# Patient Record
Sex: Male | Born: 1970 | Race: White | Hispanic: No
Health system: Western US, Community
[De-identification: ages and names within clinical notes are randomized; demographics above are authoritative.]

---

## 2014-01-12 ENCOUNTER — Encounter: Admit: 2014-01-12 | Discharge: 2014-01-12 | Primary: MD

## 2015-07-27 LAB — HERPES SIMPLEX VIRUS 1&2 IGG & IGM
HSV 1 IgG: NEGATIVE
HSV 2 IgG: NEGATIVE
Herpes Simplex 1&2, IgM: NEGATIVE

## 2015-07-27 LAB — HIV ANTIGEN & ANTIBODY SCREEN: HIV1/HIV2 Antibody/Antigen Screen: NONREACTIVE

## 2015-07-27 LAB — CHLAMYDIA BY PCR (URINE): Chlamydia trachomatis by PCR: NOT DETECTED

## 2015-07-27 LAB — TREPONEMA PALLIDUM (SYPHILIS) ANTIBODY
Treponema Antibody Index: <0.1 IV (ref <0.9)
Treponema Antibody: NEGATIVE

## 2015-07-27 LAB — NEISSERIA GONORRHOEAE BY PCR, URINE: Neisseria gonorrhoeae by PCR: NOT DETECTED

## 2017-11-06 ENCOUNTER — Inpatient Hospital Stay
Admit: 2017-11-06 | Discharge: 2017-11-06 | Disposition: A | Discharge status: Home / Self Care | Payer: PRIVATE HEALTH INSURANCE | Attending: MD

## 2017-11-06 ENCOUNTER — Emergency Department: Admit: 2017-11-06 | Payer: PRIVATE HEALTH INSURANCE | Primary: MD

## 2017-11-06 DIAGNOSIS — K469 Unspecified abdominal hernia without obstruction or gangrene: Secondary | ICD-10-CM

## 2017-11-06 LAB — CBC WITH AUTO DIFFERENTIAL
Basophils %: 0 % (ref 0–2)
Basophils, Absolute: 0.1 10*3/ÂµL (ref 0.0–0.2)
Eosinophils %: 0 % (ref 0–7)
Eosinophils, Absolute: 0.1 10*3/ÂµL (ref 0.0–0.7)
HCT: 46.8 % (ref 42.0–54.0)
Hemoglobin: 15.7 g/dL (ref 12.0–18.0)
Lymphocytes %: 8 % — ABNORMAL LOW (ref 25–45)
Lymphocytes, Absolute: 1.3 10*3/ÂµL (ref 1.1–4.3)
MCH: 30.4 pg (ref 27.0–34.0)
MCHC: 33.5 g/dL (ref 32.0–36.0)
MCV: 90.5 fL (ref 81.0–99.0)
MPV: 9 fL (ref 7.4–10.4)
Monocytes %: 6 % (ref 0–12)
Monocytes, Absolute: 0.9 10*3/ÂµL (ref 0.0–1.2)
Neutrophils %: 86 % — ABNORMAL HIGH (ref 35–70)
Neutrophils, Absolute: 14.2 10*3/ÂµL — ABNORMAL HIGH (ref 1.6–7.3)
Platelet Count: 273 10*3/ÂµL (ref 150–400)
RBC: 5.17 10*6/ÂµL (ref 4.70–6.10)
RDW: 13.4 % (ref 11.5–14.5)
WBC: 16.5 10*3/ÂµL — ABNORMAL HIGH (ref 4.8–10.8)

## 2017-11-06 LAB — COMPREHENSIVE METABOLIC PANEL
ALT - Alanine Aminotransferase: 15 IU/L (ref 7–52)
AST - Aspartate Aminotransferase: 21 IU/L (ref 10–50)
Albumin/Globulin Ratio: 1.6 (ref >0.9)
Albumin: 4.4 g/dL (ref 3.5–5.0)
Alkaline Phosphatase: 72 IU/L (ref 34–104)
Anion Gap: 12 mmol/L (ref 4–13)
BUN: 12 mg/dL (ref 6–23)
Bilirubin Total: 1 mg/dL (ref 0.3–1.2)
CO2 - Carbon Dioxide: 25 mmol/L (ref 21–31)
Calcium: 9.8 mg/dL (ref 8.6–10.3)
Chloride: 99 mmol/L (ref 98–111)
Creatinine: 0.9 mg/dL (ref 0.65–1.30)
GFR Estimate Male: >60 mL/min/{1.73_m2} (ref >=60)
Globulin: 2.8 g/dL (ref 2.2–3.7)
Glucose: 101 mg/dL — ABNORMAL HIGH (ref 80–99)
Potassium: 3.8 mmol/L (ref 3.5–5.1)
Protein Total: 7.2 g/dL (ref 6.0–8.0)
Sodium: 136 mmol/L (ref 135–143)

## 2017-11-06 LAB — PT & PTT
APTT: 37 Seconds — ABNORMAL HIGH (ref 23–36)
INR: 1.1 (ref 0.9–1.1)
Protime: 11.5 Seconds (ref 9.5–12.0)

## 2017-11-06 LAB — LIPASE: Lipase: 21 IU/L (ref 10–60)

## 2017-11-06 MED ORDER — sodium chloride 0.9 % (NS) syringe 10 mL
Freq: Once | INTRAMUSCULAR | Status: DC
Start: 2017-11-06 — End: 2017-11-06

## 2017-11-06 MED ORDER — docusate sodium (COLACE) 100 MG capsule
100 | ORAL_CAPSULE | Freq: Two times a day (BID) | ORAL | 0 refills | 30.00000 days | Status: AC
Start: 2017-11-06 — End: 2017-11-16

## 2017-11-06 MED ORDER — sodium chloride (NS) 0.9% bolus 50 mL
0.9 | Freq: Once | INTRAVENOUS | Status: DC
Start: 2017-11-06 — End: 2017-11-06

## 2017-11-06 MED ORDER — iohexol (OMNIPAQUE) 350 mg iodine/mL injection 80 mL
350 | Freq: Once | INTRAVENOUS | Status: DC | PRN
Start: 2017-11-06 — End: 2017-11-06

## 2017-11-06 MED ORDER — GI cocktail
2 | Freq: Once | Status: AC
Start: 2017-11-06 — End: 2017-11-06
  Administered 2017-11-06: 15:00:00 via ORAL

## 2017-11-06 MED ORDER — HYDROcodone-acetaminophen (NORCO) 5-325 mg per tablet
5-325 | ORAL_TABLET | Freq: Four times a day (QID) | ORAL | 0 refills | 30.00000 days | Status: AC | PRN
Start: 2017-11-06 — End: 2017-11-13

## 2017-11-06 MED ORDER — sodium chloride (NS) 0.9% bolus 1,000 mL
Freq: Once | INTRAVENOUS | Status: AC
Start: 2017-11-06 — End: 2017-11-06
  Administered 2017-11-06 (×2): via INTRAVENOUS

## 2017-11-06 MED FILL — SODIUM CHLORIDE 0.9 % INTRAVENOUS SOLUTION: 1000.0000 mL | INTRAVENOUS | Qty: 1000

## 2017-11-06 MED FILL — MAG-AL PLUS 200 MG-200 MG-20 MG/5 ML ORAL SUSPENSION: 200-200-20 | ORAL | Qty: 30

## 2017-11-06 NOTE — Discharge Instructions (Signed)
Patient Education     As we discussed, you have a small fat-containing hernia above your belly button. You may be at risk for a recurrent bowel containing hernia in the future. Follow up with a primary care doctor for ongoing on a training of this problem. Take pain medicine as instructed and continue your high-fiber diet and consider a laxative to avoid constipation. Return here if you have a lump in her abdomen, vomiting, fever, worsening pain, or if you're not improving in 48 hours. No heavy lifting for at least one week.    Hernia  A hernia happens when an organ or tissue inside your body pushes out through a weak spot in the belly (abdomen).  Follow these instructions at home:  ? Avoid stretching or overusing (straining) the muscles near the hernia.  ? Do not lift anything heavier than 10 lb (4.5 kg).  ? Use the muscles in your leg when you lift something up. Do not use the muscles in your back.  ? When you cough, try to cough gently.  ? Eat a diet that has a lot of fiber. Eat lots of fruits and vegetables.  ? Drink enough fluids to keep your pee (urine) clear or pale yellow. Try to drink 6-8 glasses of water a day.  ? Take medicines to make your poop soft (stool softeners) as told by your doctor.  ? Lose weight, if you are overweight.  ? Do not use any tobacco products, including cigarettes, chewing tobacco, or electronic cigarettes. If you need help quitting, ask your doctor.  ? Keep all follow-up visits as told by your doctor. This is important.  Contact a doctor if:  ? The skin by the hernia gets puffy (swollen) or red.  ? The hernia is painful.  Get help right away if:  ? You have a fever.  ? You have belly pain that is getting worse.  ? You feel sick to your stomach (nauseous) or you throw up (vomit).  ? You cannot push the hernia back in place by gently pressing on it while you are lying down.  ? The hernia:  ? Changes in shape or size.  ? Is stuck outside your belly.  ? Changes color.  ? Feels hard or  tender.  This information is not intended to replace advice given to you by your health care provider. Make sure you discuss any questions you have with your health care provider.  Document Released: 01/15/2010 Document Revised: 01/03/2016 Document Reviewed: 06/07/2014  Elsevier Interactive Patient Education ? 2018 [employer]       Patient Education     Ventral Hernia  A ventral hernia is a bulge of tissue from inside the abdomen that pushes through a weak area of the muscles that form the front wall of the abdomen. The tissues inside the abdomen are inside a sac (peritoneum). These tissues include the small intestine, large intestine, and the fatty tissue that covers the intestines (omentum). Sometimes, the bulge that forms a hernia contains intestines. Other hernias contain only fat. Ventral hernias do not go away without surgical treatment.  There are several types of ventral hernias. You may have:  ? A hernia at an incision site from previous abdominal surgery (incisional hernia).  ? A hernia just above the belly button (epigastric hernia), or at the belly button (umbilical hernia). These types of hernias can develop from heavy lifting or straining.  ? A hernia that comes and goes (reducible hernia). It may be  visible only when you lift or strain. This type of hernia can be pushed back into the abdomen (reduced).  ? A hernia that traps abdominal tissue inside the hernia (incarcerated hernia). This type of hernia does not reduce.  ? A hernia that cuts off blood flow to the tissues inside the hernia (strangulated hernia). The tissues can start to die if this happens. This is a very painful bulge that cannot be reduced. A strangulated hernia is a medical emergency.  What are the causes?  This condition is caused by abdominal tissue putting pressure on an area of weakness in the abdominal muscles.  What increases the risk?  The following factors may make you more likely to develop this condition:  ? Being  male.  ? Being 6 or older.  ? Being overweight or obese.  ? Having had previous abdominal surgery, especially if there was an infection after surgery.  ? Having had an injury to the abdominal wall.  ? Having had several pregnancies.  ? Having a buildup of fluid inside the abdomen (ascites).  What are the signs or symptoms?  The only symptom of a ventral hernia may be a painless bulge in the abdomen. A reducible hernia may be visible only when you strain, cough, or lift. Other symptoms may include:  ? Dull pain.  ? A feeling of pressure.  Signs and symptoms of a strangulated hernia may include:  ? Increasing pain.  ? Nausea and vomiting.  ? Pain when pressing on the hernia.  ? The skin over the hernia turning red or purple.  ? Constipation.  ? Blood in the stool (feces).  How is this diagnosed?  This condition may be diagnosed based on:  ? Your symptoms.  ? Your medical history.  ? A physical exam. You may be asked to cough or strain while standing. These actions increase the pressure inside your abdomen and force the hernia through the opening in your muscles. Your health care provider may try to reduce the hernia by pressing on it.  ? Imaging studies, such as an ultrasound or CT scan.  How is this treated?  This condition is treated with surgery. If you have a strangulated hernia, surgery is done as soon as possible. If your hernia is small and not incarcerated, you may be asked to lose some weight before surgery.  Follow these instructions at home:  ? Follow instructions from your health care provider about eating or drinking restrictions.  ? If you are overweight, your health care provider may recommend that you increase your activity level and eat a healthier diet.  ? Do not lift anything that is heavier than 10 lb (4.5 kg).  ? Return to your normal activities as told by your health care provider. Ask your health care provider what activities are safe for you. You may need to avoid activities that increase  pressure on your hernia.  ? Take over-the-counter and prescription medicines only as told by your health care provider.  ? Keep all follow-up visits as told by your health care provider. This is important.  Contact a health care provider if:  ? Your hernia gets larger.  ? Your hernia becomes painful.  Get help right away if:  ? Your hernia becomes increasingly painful.  ? You have pain along with any of the following:  ? Changes in skin color in the area of the hernia.  ? Nausea.  ? Vomiting.  ? Fever.  Summary  ? A ventral  hernia is a bulge of tissue from inside the abdomen that pushes through a weak area of the muscles that form the front wall of the abdomen.  ? This condition is treated with surgery, which may be urgent depending on your hernia.  ? Do not lift anything that is heavier than 10 lb (4.5 kg), and follow activity instructions from your health care provider.  This information is not intended to replace advice given to you by your health care provider. Make sure you discuss any questions you have with your health care provider.  Document Released: 07/14/2012 Document Revised: 03/14/2016 Document Reviewed: 03/14/2016  Elsevier Interactive Patient Education ? 2018 Elsevier Inc.

## 2017-11-06 NOTE — ED Triage Notes (Signed)
Pt c/o abdominal pain that started last night. Denies n/v no diarrhea. Pt states hurts from middle rib cage down to belly button.

## 2017-11-06 NOTE — ED Provider Notes (Addendum)
[facility] EMERGENCY DEPARTMENT ENCOUNTER    History and Physical     Name: Kevin Adams  DOB: 1970/09/10 46 y.o.  MRN: [id number]  CSN: [id number]    HISTORY:     CHIEF COMPLAINT    Chief Complaint   Patient presents with   . Abdominal Pain       HPI    Bakary Bramer is a 47 y.o. male with no relevant medical or surgical history who presents with abrupt onset of moderate severity left upper and epigastric abdominal pain, which started at 11 PM last night, immediately after masturbating. He has never had this before. Pain does not radiate to his back. At first he thought it was a muscle cramp in his abdomen and so he tried to massage his abdominal muscles. This seemed to help but the pain persisted throughout the night and continued this morning. It is not changed in location or quality or severity. No fever. No vomiting. No diarrhea. No unusual food exposures. No history of abdominal surgery.     PAST MEDICAL HISTORY    History reviewed. No pertinent past medical history.    SURGICAL HISTORY    History reviewed. No pertinent surgical history.    CURRENT MEDICATIONS    Prior to Admission medications    Not on File       ALLERGIES    Patient has no known allergies.    FAMILY HISTORY    History reviewed. No pertinent family history.    SOCIAL HISTORY    Social History     Tobacco Use   . Smoking status: Not on file   Substance Use Topics   . Alcohol use: Not on file   . Drug use: Not on file       REVIEW OF SYSTEMS:   Constitutional:  No fever.  Eyes:  No photophobia or discharge.    HENT:  No sore throat.  Respiratory:  No cough or shortness of breath.   Cardiovascular:  No palpitations or swelling.   GI:  No vomiting or diarrhea. Otherwise as above.  Musculoskeletal:  No extremity complaints.   Skin:  No rash.  Neurologic:  No focal weakness.   All systems negative except as marked.     PHYSICAL EXAM:   VITAL SIGNS:    Initial Vitals [11/06/17 0738]   BP (!) 139/97   Pulse 61   Resp 20   Temp 36.9 ?C (98.4  ?F)   SpO2 100 %       Constitutional:  Well developed, moderate distress.   HENT:  Normocephalic. Oropharynx clear, Nose normal. Neck- Normal range of motion, Supple, No stridor.   Eyes:  Pupils are round and reactive, EOMI, Conjunctiva normal, No discharge.   Respiratory:  Breath sounds normal.  Cardiovascular: Rate and rhythm: Normal. No appreciable murmurs.  GI:  Tender to palpation to the epigastrium and left upper quadrant, mild tenderness to the periumbilical area, no rebound or guarding, negative Wade sign, no focal McBurney's tenderness. No obvious masses. Specifically no pulsatile masses.  GU: No costovertebral angle tenderness.  Musculoskeletal: No cyanosis. No edema. Calf circumference symmetric.   Integument:  Warm, Dry, No erythema, No rash.   Neurologic:  Cranial nerves symmetric. Grip strength symmetric, No focal deficits noted. Gait not assessed.  Psychiatric: Normal affect.    SHARED DECISION MAKING  I reviewed medical records and nursing notes. The differential diagnosis, discussed with the patient, includes (but is not limited to) pancreatitis, gastritis, peptic ulcer  disease, duodenal ulcer. The location of his pain is not strongly suggestive of acute appendicitis or acute cholecystitis, and he has negative [place] sign. His exam is not strongly suggestive of bowel obstruction or diverticulitis. Pyelonephritis and urolithiasis also lower on the differential. We also discussed diagnostic and management options which could include CT abdomen pelvis, GI cocktail, IV fluids, laboratory studies. Risks could include radiation, contrast nephropathy, allergic reaction. Questions were addressed, and he agreed to proceed.    DATA:     LABS    Results for orders placed or performed during the hospital encounter of 11/06/17 (from the past 24 hour(s))   CBC with Auto Differential -STAT    Collection Time: 11/06/17  7:50 AM   Result Value Ref Range    WBC 16.5 (H) 4.8 - 10.8 10*3/?L    RBC 5.17 4.70 - 6.10  10*6/?L    Hemoglobin 15.7 12.0 - 18.0 g/dL    HCT 54.0 98.1 - 19.1 %    MCV 90.5 81.0 - 99.0 fL    MCH 30.4 27.0 - 34.0 pg    MCHC 33.5 32.0 - 36.0 g/dL    RDW 47.8 29.5 - 62.1 %    Platelet Count 273 150 - 400 10*3/?L    MPV 9.0 7.4 - 10.4 fL    Neutrophils % 86 (H) 35 - 70 %    Lymphocytes % 8 (L) 25 - 45 %    Monocytes % 6 0 - 12 %    Eosinophils % 0 0 - 7 %    Basophils % 0 0 - 2 %    Neutrophils, Absolute 14.2 (H) 1.6 - 7.3 10*3/?L    Lymphocytes, Absolute 1.3 1.1 - 4.3 10*3/?L    Monocytes, Absolute 0.9 0.0 - 1.2 10*3/?L    Eosinophils, Absolute 0.1 0.0 - 0.7 10*3/?L    Basophils, Absolute 0.1 0.0 - 0.2 10*3/?L   Comprehensive Metabolic Panel -STAT    Collection Time: 11/06/17  7:50 AM   Result Value Ref Range    Sodium 136 135 - 143 mmol/L    Potassium 3.8 3.5 - 5.1 mmol/L    Chloride 99 98 - 111 mmol/L    CO2 - Carbon Dioxide 25 21 - 31 mmol/L    Glucose 101 (H) 80 - 99 mg/dL    BUN 12 6 - 23 mg/dL    Creatinine 3.08 6.57 - 1.30 mg/dL    Calcium 9.8 8.6 - 84.6 mg/dL    AST - Aspartate Aminotransferase 21 10 - 50 IU/L    ALT - Alanine Aminotransferase 15 7 - 52 IU/L    Alkaline Phosphatase 72 34 - 104 IU/L    Bilirubin Total 1.0 0.3 - 1.2 mg/dL    Protein Total 7.2 6.0 - 8.0 g/dL    Albumin 4.4 3.5 - 5.0 g/dL    Globulin 2.8 2.2 - 3.7 g/dL    Albumin/Globulin Ratio 1.6 >0.9    Anion Gap 12 4 - 13 mmol/L    GFR Estimate Male >60 >=60 mL/min/1.67m*2    GFR Additional Info     Lipase -STAT    Collection Time: 11/06/17  7:50 AM   Result Value Ref Range    Lipase 21 10 - 60 IU/L   PT & PTT -STAT    Collection Time: 11/06/17  7:50 AM   Result Value Ref Range    Protime 11.5 9.5 - 12.0 Seconds    INR 1.1 0.9 - 1.1    APTT 37 (H)  23 - 36 Seconds       RADIOLOGY/PROCEDURES    CT abdomen pelvis with IV contrast   Final Result         CT ABDOMEN PELVIS W  11/06/2017 8:21 AM      PROVIDED CLINICAL INDICATIONS:  Abrupt onset epigastric/periumbilical pain         ADDITIONAL CLINICAL HISTORY:  None.      COMPARISON:  None.        TECHNIQUE:  Computed tomography (CT) imaging of the abdomen and pelvis is    performed following administration of intravenous contrast. Dose reduction       techniques are used including but not limited to automated exposure    control    (AEC), iterative reconstruction technique, and/or mA and/or kV dose    adjustments    based on patient size. Contrast was administered intravenously.      FINDINGS:      LOWER CHEST:  No acute findings.   LIVER:  No acute findings. There is a small low attenuated focus in the    posterior RIGHT hepatic lobe seen on image 23 of series 2 which is too    small to    characterize.   GALLBLADDER AND BILE DUCTS:  No calcified gallstones or obvious    gallbladder    inflammation.   PANCREAS:  No acute findings. No ductal dilatation.   SPLEEN:  Normal size.   ADRENAL GLANDS:  No acute findings.   KIDNEYS AND URETERS:  No obvious suspicious renal mass. No urolithiasis or       obstructive uropathy.   BLADDER:  No focal abnormality visualized.   REPRODUCTIVE ORGANS:  No acute findings.   BOWEL AND PERITONEUM:  No abnormally dilated loops of bowel are seen. No    pneumoperitoneum, abnormal peritoneal fluid or bowel inflammation is seen.   APPENDIX:  No appendicitis.   LYMPH NODES:  No pathologically enlarged lymph nodes by size criteria are    seen.   VASCULATURE:  No aneurysms seen.   BODY WALL:  There is a small fat-containing hernia just above the    umbilicus.   BONES:  No acute findings.   OTHER:  None.         IMPRESSION:   1. There is a small fat-containing hernia just above the umbilicus. No    bowel    loops extend into this hernia and no surrounding inflammatory changes are    seen.    Otherwise, no acute findings are identified to suggest etiology of    patient's    symptoms.             PLAN:     ED COURSE        Medications       Date/Time Order Dose Route Action Action by     11/06/2017 0752 sodium chloride (NS) 0.9% bolus 1,000 mL 1,000 mL Intravenous [address] Ofilia Neas, [place]     11/06/2017 0454 GI cocktail 45 mL Oral Given Ofilia Neas, RN          Prescriptions:  New Prescriptions    No medications on file       8:50 AM  Patient has a small fat-containing hernia just above the umbilicus. This is probably the cause of his symptoms because this is right where he has the pain. I reexamined him and cannot feel a fascial defect or any lump there. There is no imaging evidence of incarceration or  strangulation of intestinal contents. This hernia probably does not need to be repaired that he might be at risk for worsening fascial defect and recurrent hernia. He does not require surgical referral now but I will have him follow up with her primary care doctor for ongoing monitoring of this condition. Meanwhile, hydrocodone if needed for pain, Colace to prevent constipation, and no heavy lifting for at least one week. We reviewed follow-up instructions and return precautions in detail.    ASSESSMENT  47 y.o. male with acute small fat-containing hernia.    11:20 AM  I received a phone call from the patient's girlfriend saying he is not much better after one hydrocodone and 400 mg of ibuprofen. I recommended additional ibuprofen and 2 hydrocodone tablets every 4 hours, but also explained that if his pain is worsening he is welcome to return for further evaluation and management in the emergency department.       Winfield Cunas, MD  11/06/17 1610       Winfield Cunas, MD  11/06/17 1121

## 2017-12-08 ENCOUNTER — Other Ambulatory Visit: Admit: 2017-12-08 | Discharge: 2017-12-08 | Payer: PRIVATE HEALTH INSURANCE | Primary: MD

## 2017-12-08 DIAGNOSIS — Z0000 Encounter for general adult medical examination without abnormal findings: Secondary | ICD-10-CM

## 2017-12-08 LAB — CORONARY RISK LIPID PANEL
Cholesterol, HDL: 54 mg/dL (ref >=40)
Cholesterol: 161 mg/dL (ref <200)
LDL Calculated: 91 mg/dL (ref <100)
Triglyceride: 81 mg/dL (ref 30–149)

## 2020-06-08 ENCOUNTER — Emergency Department: Admit: 2020-06-09 | Payer: PRIVATE HEALTH INSURANCE | Primary: MD

## 2020-06-08 DIAGNOSIS — S01312A Laceration without foreign body of left ear, initial encounter: Secondary | ICD-10-CM

## 2020-06-08 NOTE — Discharge Instructions (Signed)
Keep the area clean and dry  Topical antibiotics as directed  Tylenol, ibuprofen as directed  Return for worsening headache, any signs of infection, new concerns  Follow up with primary care, occupational health

## 2020-06-08 NOTE — ED Provider Notes (Addendum)
[facility] EMERGENCY DEPARTMENT ENCOUNTER    History and Physical     Name: Kevin Adams  DOB: March 24, 1971 49 y.o.  MRN: [id number]  CSN: [id number]    HISTORY:     CC:   Head Injury and Ear Laceration    Chief Complaint   Patient presents with   . Head Injury   . Ear Laceration       HPI:   Kevin Adams is a 49 y.o. 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 No LOC, states had some bleeding from the ear, behind the ear, felt dizzy. Feels lightheaded with walking/moving. No significant neck pain. Had taken ibuprofen 1-2 hours prior to injury. Injury occurred at 1800. No LOC, no blood thinner use.     PMH:   He  has no past medical history on file.    PSH:   He  has no past surgical history on file.     Medications:   Previous Medications    No medications on file       Allergies:   He has No Known Allergies.    Social History:    He  has no history on file for tobacco use. He  has no history on file for alcohol use. He  has no history on file for drug use.       Review of Systems:   As in HPI otherwise complete review of systems negative except for those listed above.      Physical Exam   VITAL SIGNS: Blood pressure (!) 142/104, pulse 76, temperature 36.5 ?C (97.7 ?F), temperature source Tympanic, resp. rate 14, weight 68 kg (150 lb), SpO2 100 %.      Constitutional: No acute distress. Non-toxic appearance.    HEENT: Normocephalic. Atraumatic. Oropharynx is clear with moist mucous membranes. Pupils equal, extra occular movement intact. Blood in the left EAC, associated with a small wound to the posterior ear canal.  But TM appears intact.  Superficial laceration, approximately 1 cm over the left mastoid.  Bleeding is currently controlled. No mastoid tenderness. PERRLA, no hemotympanum    Neck:  Supple and nontender without adenopathy. No midline tenderness    Chest: Lungs clear to auscultation without wheezes,  rales or rhonchi.    Cardiovascular: Regular rate and rhythm without murmurs, rubs or gallops.    Skin: Warm and dry. No erythema. No rash.  As above     Back: The range of motion    Extremities: Bilateral upper and lower extremities with 5/5 strength against resistance  Neurologic: Alert & oriented, Normal motor function, no deficits noted.  Cranial nerves II through XII intact with good coordination and tone  ED Course and Medical Decision Making:    Kevin Adams presented to the ED for evaluation.  Nursing notes reviewed.  Based on his history and exam the differential diagnosis includes closed head injury, intracranial bleeding, skull fracture, ear injury, ruptured TM.  Is have a superficial laceration behind the left ear, along the mastoid.  This was cleansed, Dermabond did with good approximation.  He has a wound along the inner aspect of the ear canal which does not appear suturable.  Will place on ofloxacin drops to prevent infection.  Wound care was provided.  Does not appear to have TM rupture, no signs of skull fracture or intracranial bleeding on CT imaging.  Discussed symptomatic treatment, signs and symptoms in which to return immediately to the ER.  Recommend primary care follow-up.  Home in no acute distress.  His tetanus immunization was updated.      RADIOLOGY    CT head without contrast   Final Result by Hedda Slade, MD (10/29 1916)         CT HEAD WO 06/08/2020 7:12 PM      PROVIDED CLINICAL INDICATIONS:  head injury/pain      ADDITIONAL CLINICAL HISTORY: Headache after head injury.      COMPARISON:  None.      TECHNIQUE:  Computed tomography (CT) imaging of the head. No contrast is    administered. Dose reduction techniques were used including but not    limited to    automated exposure control (AEC), iterative reconstruction technique,    and/or mA    and/or kV dose adjustments based on patient size.      FINDINGS:      EXTRA-AXIAL SPACES:  Normal.   CEREBRUM:  No intracranial  hemorrhage, mass effect or evidence of infarct.   CEREBELLUM:  Normal.   BRAINSTEM:  Normal.   ORBITS:  Normal as visualized.   PARANASAL SINUSES/MASTOID AIR CELLS:  Normal as visualized.   BONES:  Normal.   OTHER:  None.         IMPRESSION:   1.  No acute intracranial abnormality.                 Medication Given in ED:    Medications   Tetanus-diphtheria-acellular pertussis (Tdap) (BOOSTRIX) injection 0.5 mL (0.5 mL Intramuscular Given 06/08/20 1952)       Labs    Labs Reviewed - No data to display    CLINICAL IMPRESSION:    SNOMED CT(R)   1. Injury of ear, initial encounter  INJURY OF EAR         PLAN:  There are no discharge medications for this patient.    Bary Castilla, MD  [address]  [place] [address]  [phone]          [facility]  [address]  [address] [place] [address]  [phone]             Elnora Morrison, NP  06/09/20 0111       Elnora Morrison, NP  07/04/20 1736

## 2020-06-08 NOTE — ED Provider Notes (Signed)
I saw this patient in my role as the ED provider at triage.  As such I did a limited history and physical exam to confirm the patient's triage level and order appropriate studies (if any) to begin the patient's workup. The patient understands that further evaluation will be done when they are brought back to an ED room.      Chief complaint: Head injury    Pertinent findings: Hit in back of head with metal bar, no LOC but felt dizzy immediately after. Laceration behind left ear, blood noted to left external canal, no obvious blood to TM. Patient is ambulatory with steady gait and is A/O x 4.     Plan: CT head without contrast, TDAP           Gwenlyn Perking, FNP  06/08/20 1839

## 2020-06-08 NOTE — ED Triage Notes (Signed)
C/C of head injury after hitting his head with a metal pole at work. Patient has laceration to left ear. Patient unsure of last tetanus. Patient denies loc or anticoagulants.

## 2020-06-09 ENCOUNTER — Inpatient Hospital Stay
Admit: 2020-06-09 | Discharge: 2020-06-09 | Disposition: A | Discharge status: Home / Self Care | Payer: PRIVATE HEALTH INSURANCE | Attending: Emergency

## 2020-06-09 MED ORDER — ofloxacin (OCUFLOX) 0.3 % ophthalmic solution 5 drop
0.3 | Freq: Two times a day (BID) | OPHTHALMIC | Status: DC
Start: 2020-06-09 — End: 2020-06-08
  Administered 2020-06-09: 05:00:00 0.3 [drp] via OTIC

## 2020-06-09 MED ORDER — Tetanus-diphtheria-acellular pertussis (Tdap) (BOOSTRIX) injection 0.5 mL
2.5-8-5 | INTRAMUSCULAR | Status: AC
Start: 2020-06-09 — End: 2020-06-08
  Administered 2020-06-09: 03:00:00 via INTRAMUSCULAR

## 2020-06-09 MED FILL — BOOSTRIX TDAP 2.5 LF UNIT-8 MCG-5 LF/0.5 ML INTRAMUSCULAR SYRINGE: 2.5-8-5 Lf-mcg-Lf/0.5mL | INTRAMUSCULAR | Qty: 0.5

## 2020-06-09 MED FILL — OFLOXACIN 0.3 % EYE DROPS: 0.3 % | OPHTHALMIC | Qty: 5

## 2021-10-10 IMAGING — MR MRI LUMBAR SPINE WITHOUT CONTRAST
4 of 6 series · 22 of 48 positions shown · IV contrast (gadolinium)
Comparison: None

________________________________________________________________________________________________ 
MRI LUMBAR SPINE WITHOUT CONTRAST, 10/10/2021 [DATE]: 
CLINICAL INDICATION: Low back pain
TECHNIQUE: Multiplanar, multiecho position MR images of the lumbar spine were 
performed without intravenous gadolinium enhancement. Patient was scanned on a 
1.5T magnet.

[Series 101: survey · axial · 10.0mm · 1.25mm/px · z∈[-33,+201]mm · 5 of 10 slices shown]
[im 1/10]
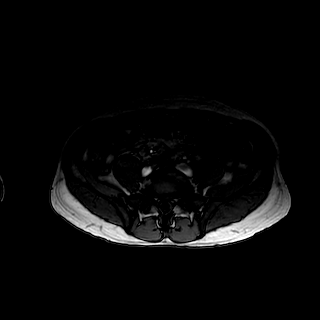
[im 3/10]
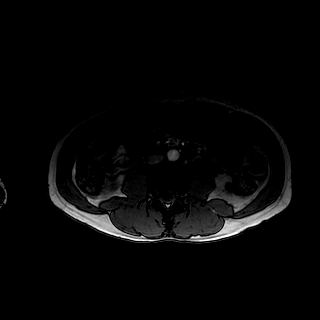
[im 5/10]
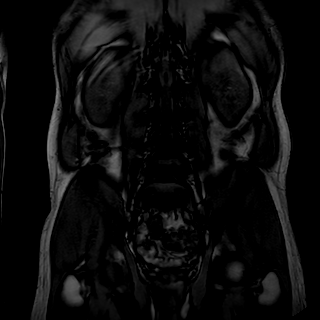
[im 7/10]
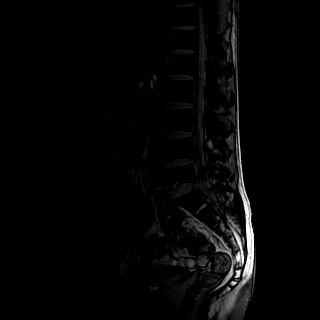
[im 10/10]
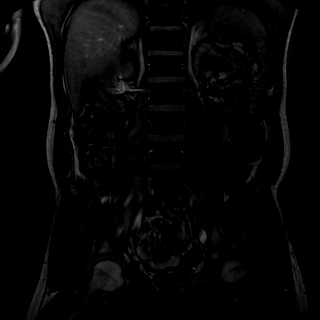

[Series 201: t2w_cor-surv · coronal · 6.0mm · 0.62mm/px · 5 of 10 slices shown]
[im 1/10]
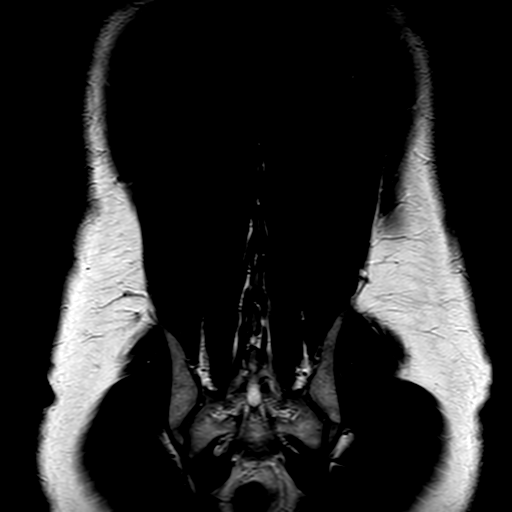
[im 3/10]
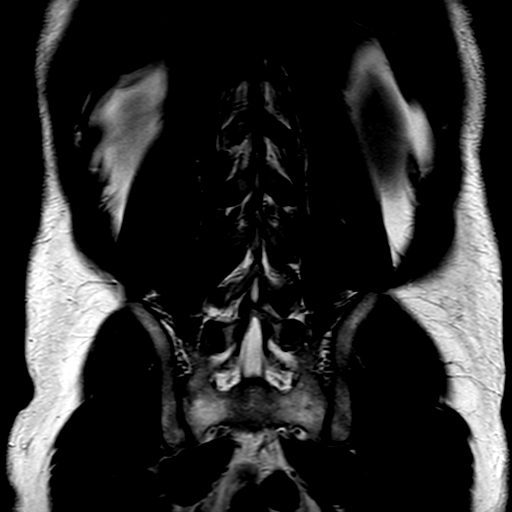
[im 5/10]
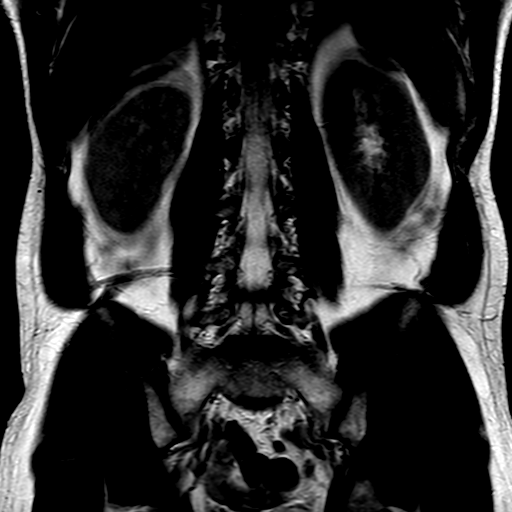
[im 7/10]
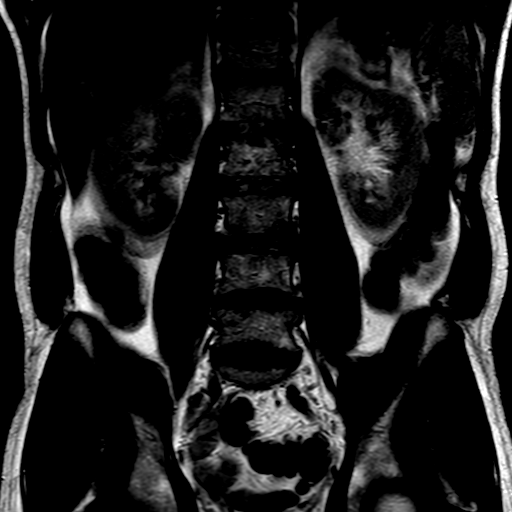
[im 10/10]
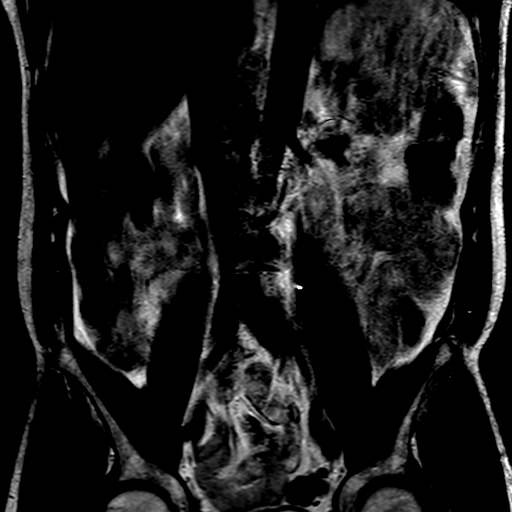

[Series 301: t1_tse_sag · sagittal · 4.0mm · 0.51mm/px · 8 of 17 slices shown]
[im 1/17]
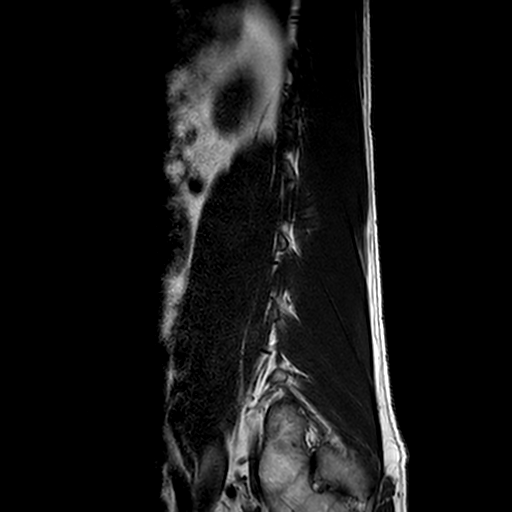
[im 3/17]
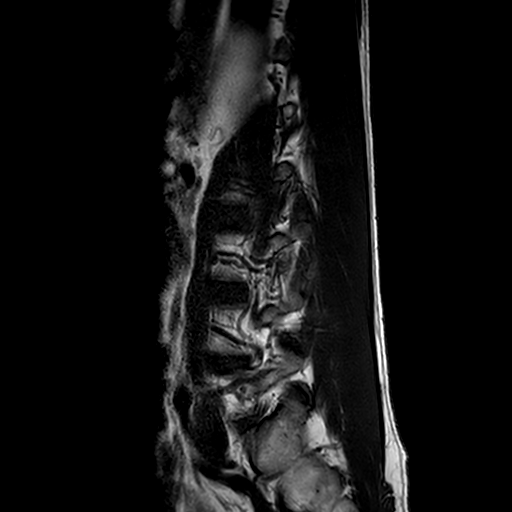
[im 5/17]
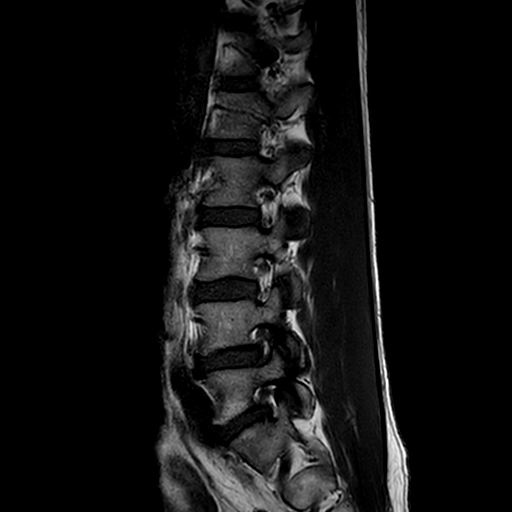
[im 7/17]
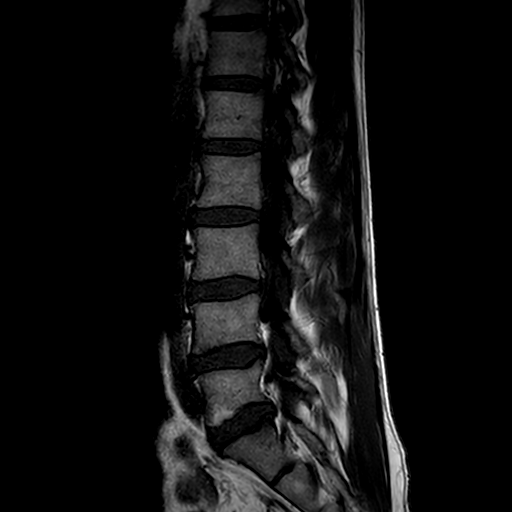
[im 10/17]
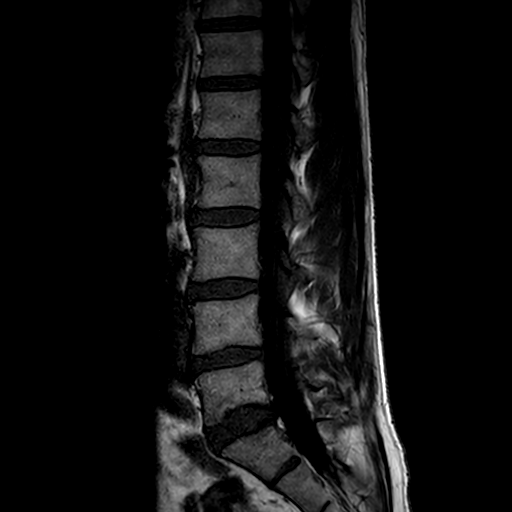
[im 12/17]
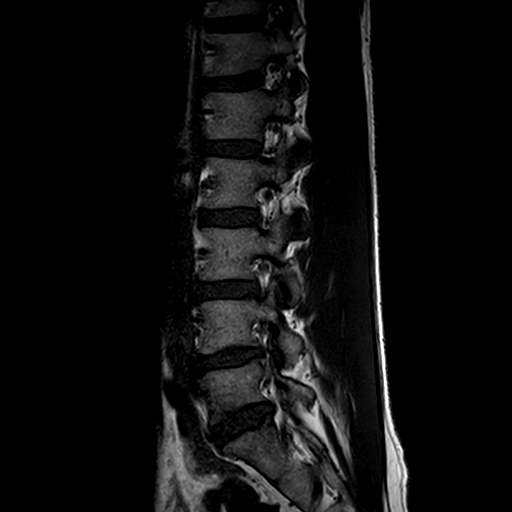
[im 14/17]
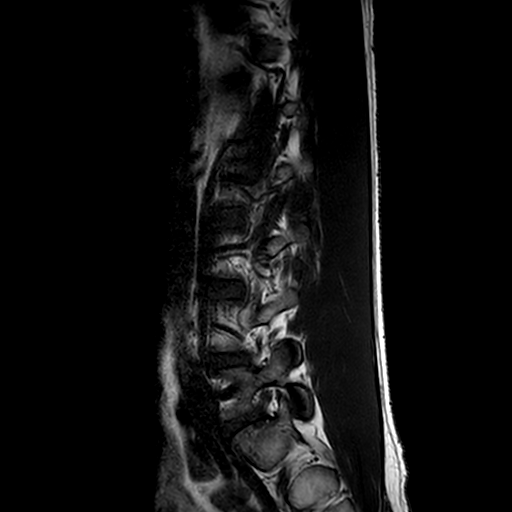
[im 17/17]
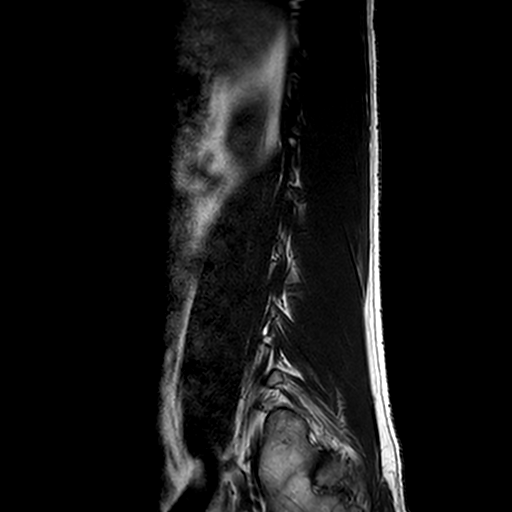

[Series 401: t2_tse_sag · sagittal · 4.0mm · 0.54mm/px · 4 of 17 slices shown]
[im 1/17]
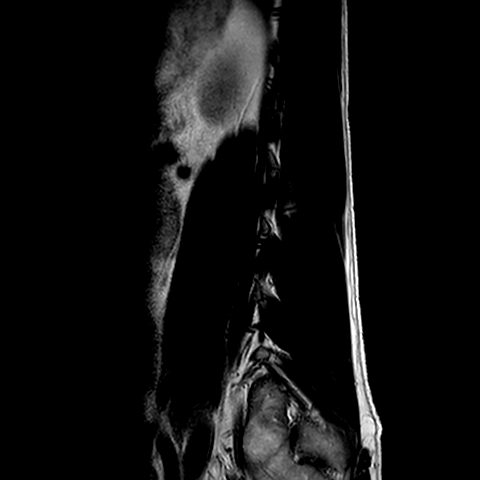
[im 3/17]
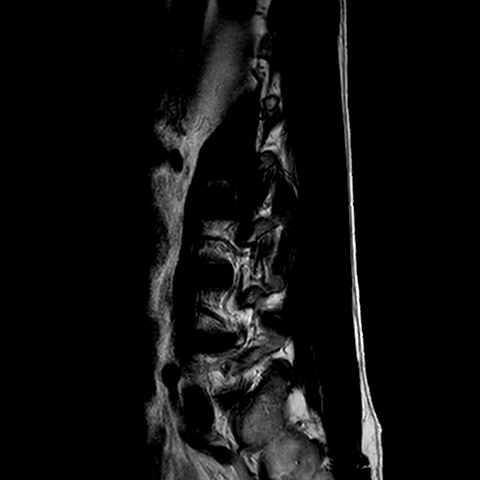
[im 10/17]
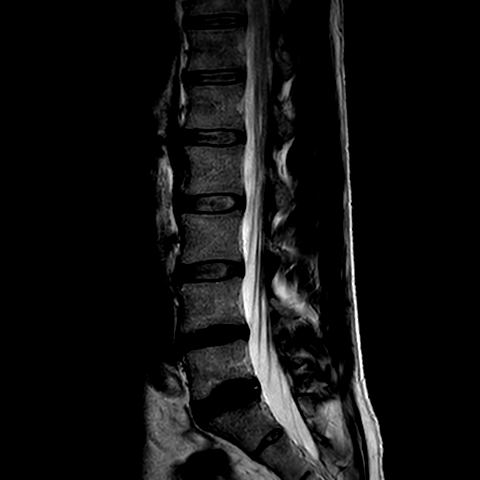
[im 14/17]
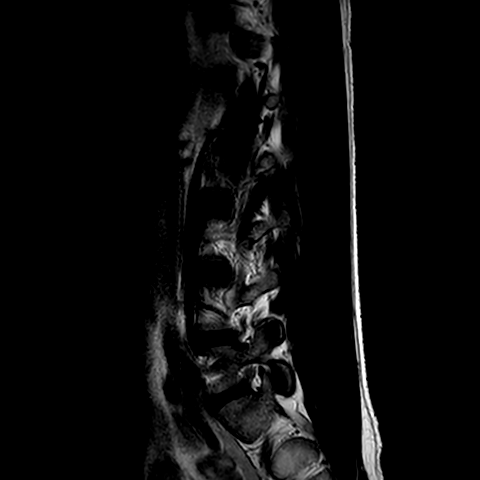

[22 of 48 positions shown; findings below may reference images not displayed]

FINDINGS: Nomenclature is based on 5 lumbar type vertebral bodies. The vertebral 
bodies are normal in height and alignment. No acute vertebral body fracture. 
Multifocal degenerative change. No spondylolisthesis. No pars defect. The conus 
tip terminates at the L1 vertebral body level. The aorta is normal in diameter. 
The posterior paraspinal soft tissues are negative.  
Modic I-II: Mild type I Modic changes along the L5 inferior endplate Schmorl 
node. 
Ligamentum Flavum > 2.5 mm: All levels. 
T12-L1: The disc is normal in height and signal. No disc herniation. Normal 
facets. No spinal canal or neural foraminal stenosis. 
L1-L2: The disc is normal in height and signal. No disc herniation. Normal 
facets. No spinal canal or neural foraminal stenosis. 
L2-L3: The disc is normal in height and signal. No disc herniation. Normal 
facets. No spinal canal or neural foraminal stenosis. 
L3-L4: The disc is normal in height and signal. No disc herniation. Normal 
facets. No spinal canal or neural foraminal stenosis. 
L4-L5: Mild disc bulge, mild disc space narrowing and disc desiccation. No disc 
herniation. Moderate left facet arthropathy. No central canal stenosis. Mild 
lateral recess/left neural foraminal stenosis.  
L5-S1: Right paracentral annular tear, mild disc bulge and disc desiccation. The 
disc is normal in height. No disc herniation. Mild facet arthropathy. No central 
canal stenosis. Mild left lateral recess/bilateral neural foraminal stenosis.
IMPRESSION: 1.  Multilevel degenerative change. 
2.  L4-L5 mild lateral recess/left neural foraminal stenosis.  
3.  L5-S1 right paracentral annular tear and mild left lateral recess/bilateral 
neural foraminal stenosis.

## 2021-12-03 IMAGING — CT CT ABDOMEN PELVIS W/ UROGRAM
2 of 6 series · 12 of 46 positions shown, 14 images · IV contrast (APPLIED)
Comparison: No pertinent comparison exams.

________________________________________________________________________________________________ 
******** ADDENDUM #1 ********/n 
***CORRECTED REPORT, TECHNIQUE EDITED*** 
The region of interest was scanned without and with 100 mL of ISOVUE 300 
contrast injected intravenously 
CT ABDOMEN PELVIS W/ UROGRAM, 12/03/2021 [DATE]: 
CLINICAL INDICATION: Patient is being worked up for microhematuria. 
A search for DICOM formatted images was conducted for prior CT imaging studies 
completed at a non-affiliated media free facility.
TECHNIQUE: The region of interest was scanned without and with Renal 
parenchymal volume appears to be normal for age. Renal enhancement appears 
normal and is bilaterally symmetric. The renal veins are patent bilaterally. 
Small bilateral renal cysts are present measuring up to 1.5 cm. No solid renal 
parenchymal mass is detected. The RIGHT renal collecting structures are 
decompressed. No RIGHT-sided nephrolithiasis and no suspicious RIGHT sided 
urothelial abnormality is found. In the LEFT kidney there is mild 
pelvocaliectasis which appears secondary to a 11 x 7 mm calculus in the LEFT 
renal pelvis. There is an additional 3 x 2 mm nonobstructing calculus in a lower 
pole calyx of the LEFT kidney. There is mild diffuse urothelial thickening 
involving the proximal LEFT ureter which is likely indicative of reactive 
inflammation due to the stone in the LEFT renal pelvis. The ureters are 
otherwise unremarkable. The urinary bladder is unremarkable. The prostate is 
minimally enlarged. There is diffuse enhancement of the prostate suggesting 
potential active prostatitis. There is asymmetric enhancement of the walls of 
the RIGHT seminal vesicle potentially indicating asymmetric inflammation. Left 
seminal vesicle appears normal. Multiple calcified pelvic venous phleboliths are 
present. 
There is chronic diverticular disease incidentally noted in the LEFT colon. No 
acute colonic pathology is detected. No active pathology of the small bowel is 
found. The stomach is unremarkable. The liver appears normal. Hepatic and portal 
veins are patent. The gallbladder and biliary tract are unremarkable. No 
pancreatic pathology is found. The spleen is within normal limits. The adrenal 
glands are unremarkable. The inferior vena cava and abdominal aorta are normal 
in caliber. Minimal atherosclerotic calcifications are present. No abnormal 
abdominal or pelvic lymph nodes are found. There is no free intraperitoneal 
fluid. Anterior abdominal wall is intact. Limited imaging of the inferior thorax 
reveals a partially visible 4 mm nodule in the LEFT lower lobe on axial image 1 
of series 5. Minimal degenerative changes are in the spine. mL of Isovue 300 
contrast injected intravenously on a high resolution CT scanner using dose 
reduction techniques.  Routine MPR reconstructions were performed.

[Series 5: portal · axial · portal-venous · 0.66mm/px · z∈[-498,-114]mm · 9 of 154 slices shown, 11 images]
[im 13/154  soft-tissue]
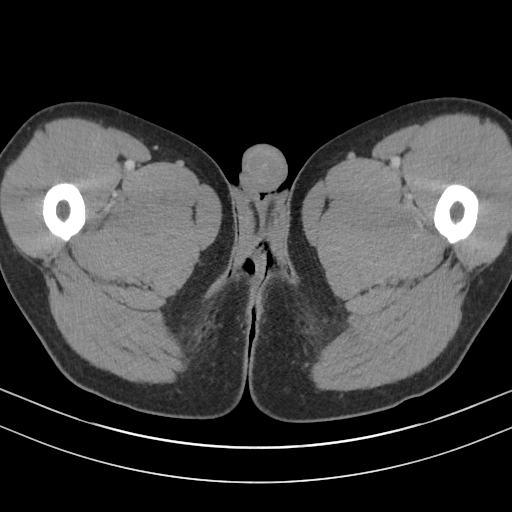
[im 13/154  bone]
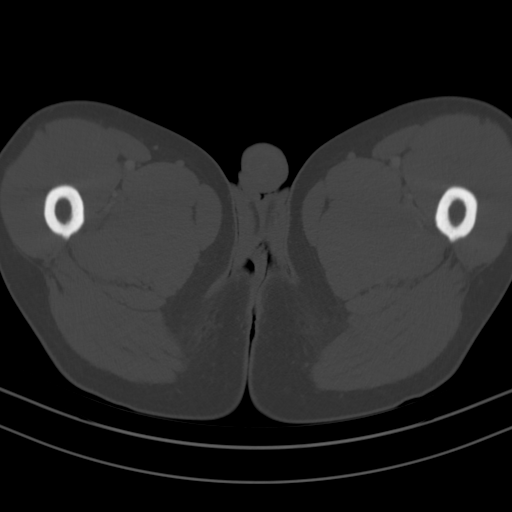
[im 26/154  soft-tissue]
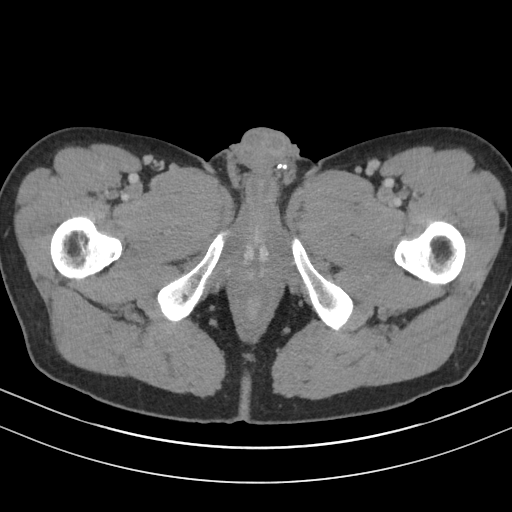
[im 52/154  soft-tissue]
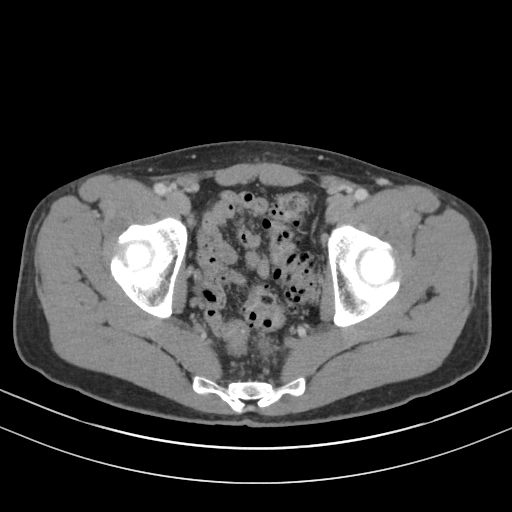
[im 64/154  soft-tissue]
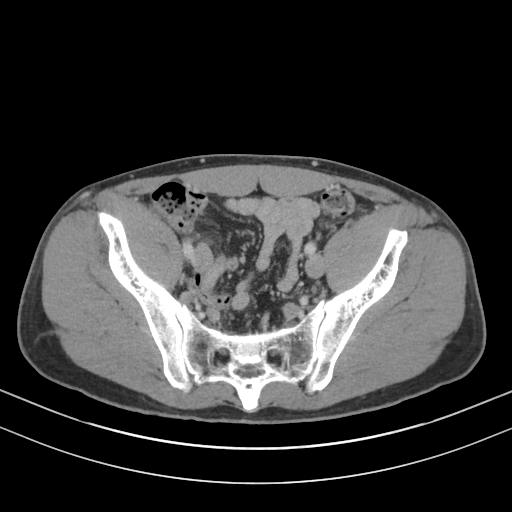
[im 77/154  soft-tissue]
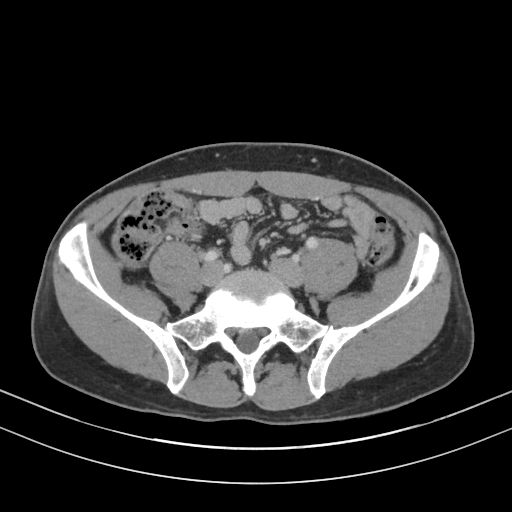
[im 90/154  soft-tissue]
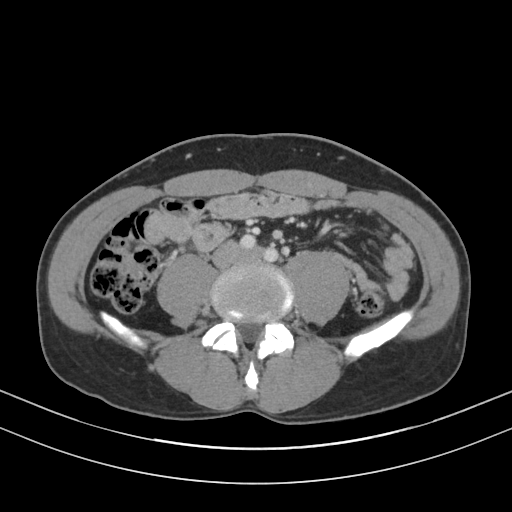
[im 103/154  soft-tissue]
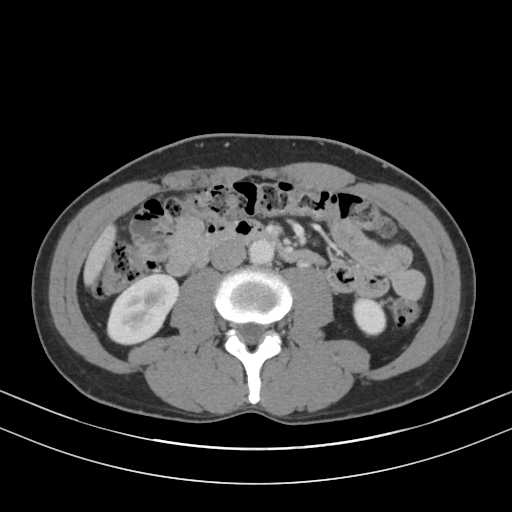
[im 128/154  soft-tissue]
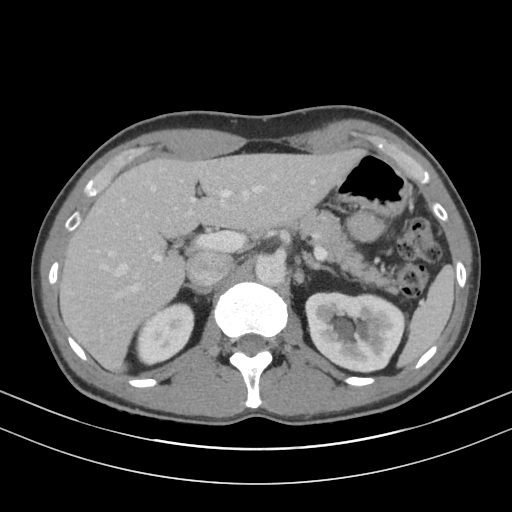
[im 141/154  soft-tissue]
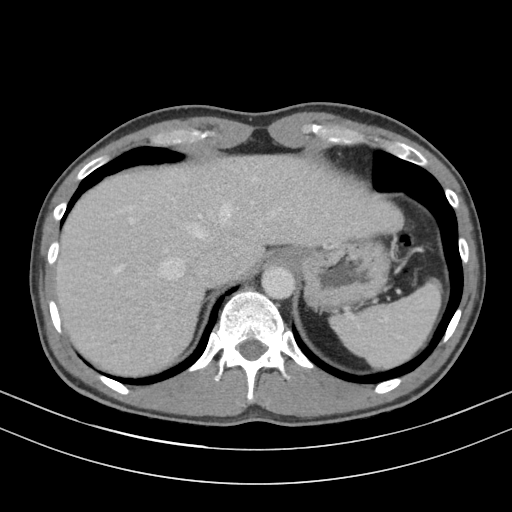
[im 141/154  bone]
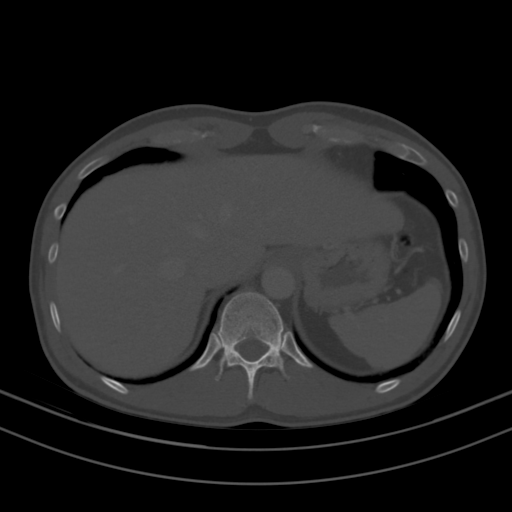

[Series 6: coronal · coronal · 0.73mm/px · 3 of 135 slices shown]
[im 34/135  soft-tissue]
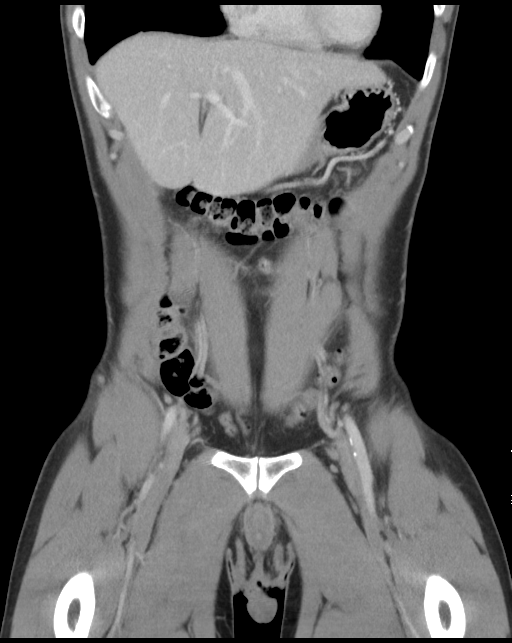
[im 68/135  soft-tissue]
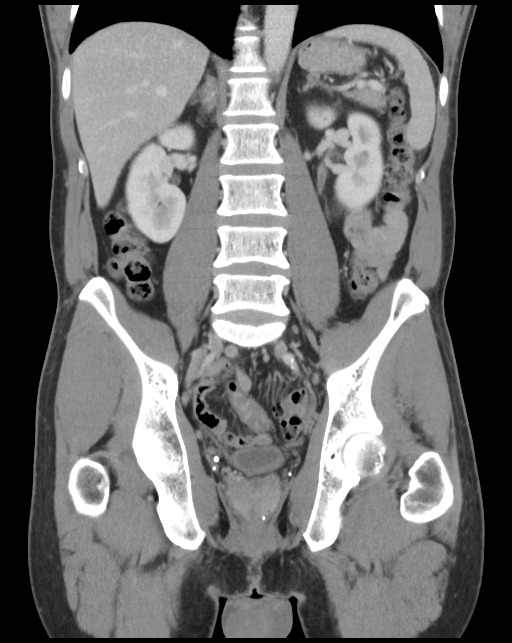
[im 101/135  soft-tissue]
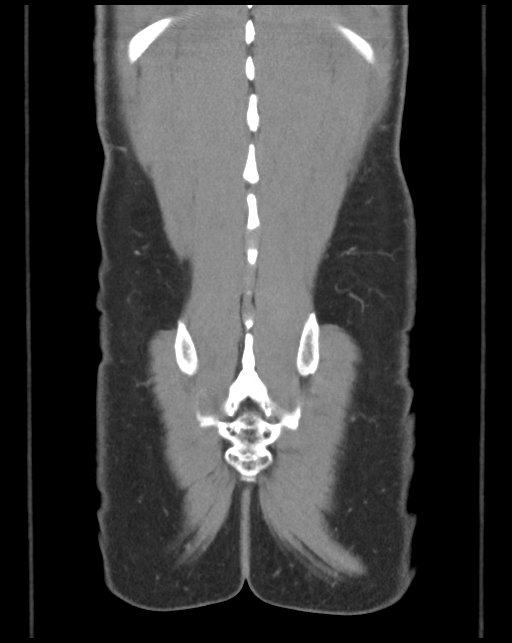

[12 of 46 positions shown; findings below may reference images not displayed]

FINDINGS: Renal parenchymal volume appears to be normal for age. Renal enhancement appears 
normal and is bilaterally symmetric. The renal veins are patent bilaterally. 
Small bilateral renal cysts are present measuring up to 1.5 cm. No solid renal 
parenchymal mass is detected. The RIGHT renal collecting structures are 
decompressed. No RIGHT-sided nephrolithiasis and no suspicious RIGHT sided 
urothelial abnormality is found. In the LEFT kidney there is mild 
pelvocaliectasis which appears secondary to a 11 x 7 mm calculus in the LEFT 
renal pelvis. There is an additional 3 x 2 mm nonobstructing calculus in a lower 
pole calyx of the LEFT kidney. There is mild diffuse urothelial thickening 
involving the proximal LEFT ureter which is likely indicative of reactive 
inflammation due to the stone in the LEFT renal pelvis. The ureters are 
otherwise unremarkable. The urinary bladder is unremarkable. The prostate is 
minimally enlarged. There is diffuse enhancement of the prostate suggesting 
potential active prostatitis. There is asymmetric enhancement of the walls of 
the RIGHT seminal vesicle potentially indicating asymmetric inflammation. Left 
seminal vesicle appears normal. Multiple calcified pelvic venous phleboliths are 
present. 
There is chronic diverticular disease incidentally noted in the LEFT colon. No 
acute colonic pathology is detected. No active pathology of the small bowel is 
found. The stomach is unremarkable. The liver appears normal. Hepatic and portal 
veins are patent. The gallbladder and biliary tract are unremarkable. No 
pancreatic pathology is found. The spleen is within normal limits. The adrenal 
glands are unremarkable. The inferior vena cava and abdominal aorta are normal 
in caliber. Minimal atherosclerotic calcifications are present. No abnormal 
abdominal or pelvic lymph nodes are found. There is no free intraperitoneal 
fluid. Anterior abdominal wall is intact. Limited imaging of the inferior thorax 
reveals a partially visible 4 mm nodule in the LEFT lower lobe on axial image 1 
of series 5. Minimal degenerative changes are in the spine.
IMPRESSION: 1. There is an 11 x 7 mm calculus in the LEFT renal pelvis associated with mild 
LEFT pelvocaliectasis and slight enhancement of the proximal LEFT ureteral 
urothelium which is likely inflammatory. An additional 3 x 2 mm nonobstructing 
calculus is in the lower pole calyx of the LEFT kidney. 
2. There is diffuse enhancement of the prostate and asymmetric enhancement of 
the RIGHT seminal vesicle potentially indicating prostatitis with secondary 
inflammation of the RIGHT seminal vesicle. 
3. Incidental 4 mm nodule in the base of the LEFT lower lobe only partially 
visible. See reference at the end of this report. 
REFERENCE: Recommendations for pulmonary nodule follow-up according to 
[HOSPITAL] Guidelines. 
SOLITARY NODULE SIZE: <6 MM 
*LOW-RISK PATIENTS: NO FOLLOW-UP NEEDED. 
*HIGH-RISK PATIENTS: OPTIONAL CT AT 12 MONTHS. 
Solitary Nodule size: 6-8 mm 
*Low-risk patients: Followup at 6-12 months, then consider further follow-up at 
18-24 months. 
*High-risk patients: Initial followup CT at 6-12 months and then at 18-24 months 
if no change. 
Solitary Nodule size: >8 mm 
*Either low or high risk: 
*Consider follow-up CT at 3 months, and/or CT-PET, and/or biopsy. 
NOTE: 
Low risk patients: minimal or absent history of smoking and or other known risk 
factors. 
High risk patients: history of smoking or of other known risk factors (e.g. 
first degree relative with lung cancer, or exposure to asbestos, radon uranium) 
If a nodule up to 8mm is partly solid or is ground glass further follow up is 
required after 24 months to exclude possible slow growing adenocarcinoma ([facility]) 
Size is average of length and width. 
RADIATION DOSE REDUCTION: All CT scans are performed using radiation dose 
reduction techniques, when applicable.  Technical factors are evaluated and 
adjusted to ensure appropriate moderation of exposure.  Automated dose 
management technology is applied to adjust the radiation doses to minimize 
exposure while achieving diagnostic quality images.

## 2021-12-05 ENCOUNTER — Other Ambulatory Visit: Admit: 2021-12-05 | Discharge: 2021-12-05 | Payer: PRIVATE HEALTH INSURANCE | Primary: MD

## 2021-12-05 DIAGNOSIS — Z13220 Encounter for screening for lipoid disorders: Secondary | ICD-10-CM

## 2021-12-05 LAB — CBC WITH AUTO DIFFERENTIAL
Basophils %: 1 % (ref 0–2)
Basophils, Absolute: 0.1 10*3/ÂµL (ref 0.0–0.2)
Eosinophils %: 3 % (ref 0–7)
Eosinophils, Absolute: 0.2 10*3/ÂµL (ref 0.0–0.7)
HCT: 41.2 % — ABNORMAL LOW (ref 42.0–54.0)
Hemoglobin: 14.7 g/dL (ref 12.0–18.0)
Lymphocytes %: 28 % (ref 25–45)
Lymphocytes, Absolute: 1.9 10*3/ÂµL (ref 1.1–4.3)
MCH: 31.3 pg (ref 27.0–34.0)
MCHC: 35.6 g/dL (ref 32.0–36.0)
MCV: 87.9 fL (ref 81.0–99.0)
MPV: 9.5 fL (ref 7.4–10.4)
Monocytes %: 12 % (ref 0–12)
Monocytes, Absolute: 0.8 10*3/ÂµL (ref 0.0–1.2)
Neutrophils %: 57 % (ref 35–70)
Neutrophils, Absolute: 3.8 10*3/ÂµL (ref 1.6–7.3)
Platelet Count: 227 10*3/ÂµL (ref 150–400)
RBC: 4.69 10*6/ÂµL — ABNORMAL LOW (ref 4.70–6.10)
RDW: 13.2 % (ref 11.5–14.5)
WBC: 6.7 10*3/ÂµL (ref 4.8–10.8)

## 2021-12-05 LAB — CORONARY RISK LIPID PANEL
Cholesterol, HDL: 47 mg/dL (ref >=40)
Cholesterol: 177 mg/dL (ref <200)
LDL Calculated: 102 mg/dL — ABNORMAL HIGH (ref <100)
Triglyceride: 139 mg/dL (ref 30–149)

## 2021-12-05 LAB — COMPREHENSIVE METABOLIC PANEL
ALT - Alanine Aminotransferase: 10 IU/L (ref 7–52)
AST - Aspartate Aminotransferase: 13 IU/L (ref 10–50)
Albumin/Globulin Ratio: 1.6 (ref >0.9)
Albumin: 4.1 g/dL (ref 3.5–5.0)
Alkaline Phosphatase: 94 IU/L (ref 34–104)
Anion Gap: 8 mmol/L (ref 4–13)
BUN: 10 mg/dL (ref 6–23)
Bilirubin Total: 0.6 mg/dL (ref 0.3–1.2)
CO2 - Carbon Dioxide: 28 mmol/L (ref 21–31)
Calcium: 9.6 mg/dL (ref 8.6–10.3)
Chloride: 108 mmol/L (ref 98–111)
Creatinine: 0.9 mg/dL (ref 0.65–1.30)
Globulin: 2.5 g/dL (ref 2.0–3.7)
Glomerular Filtration Rate Estimate (Male): >60 mL/min/{1.73_m2} (ref >=60)
Glucose: 101 mg/dL — ABNORMAL HIGH (ref 80–99)
Potassium: 4.3 mmol/L (ref 3.5–5.1)
Protein Total: 6.6 g/dL (ref 6.0–8.0)
Sodium: 144 mmol/L — ABNORMAL HIGH (ref 135–143)

## 2021-12-05 LAB — PSA, TOTAL AND FREE
PSA, Free: 0.67 ng/mL
PSA, Percent Free: 22 %
Prostate Specific Antigen, Total: 3.09 ng/mL (ref 0.00–4.00)

## 2021-12-05 LAB — LIPASE: Lipase: 30 IU/L (ref 10–60)

## 2022-01-16 IMAGING — DX ABDOMEN 1 VIEW
1 series · 1 of 1 positions shown · non-contrast
Comparison: CT urogram 12/03/2021.

________________________________________________________________________________________________ 
ABDOMEN 1 VIEW, 01/16/2022 [DATE]: 
CLINICAL INDICATION: nephrolithiasis, currently denies pain, follow-up 
lithotripsy and stent placement

[supine]
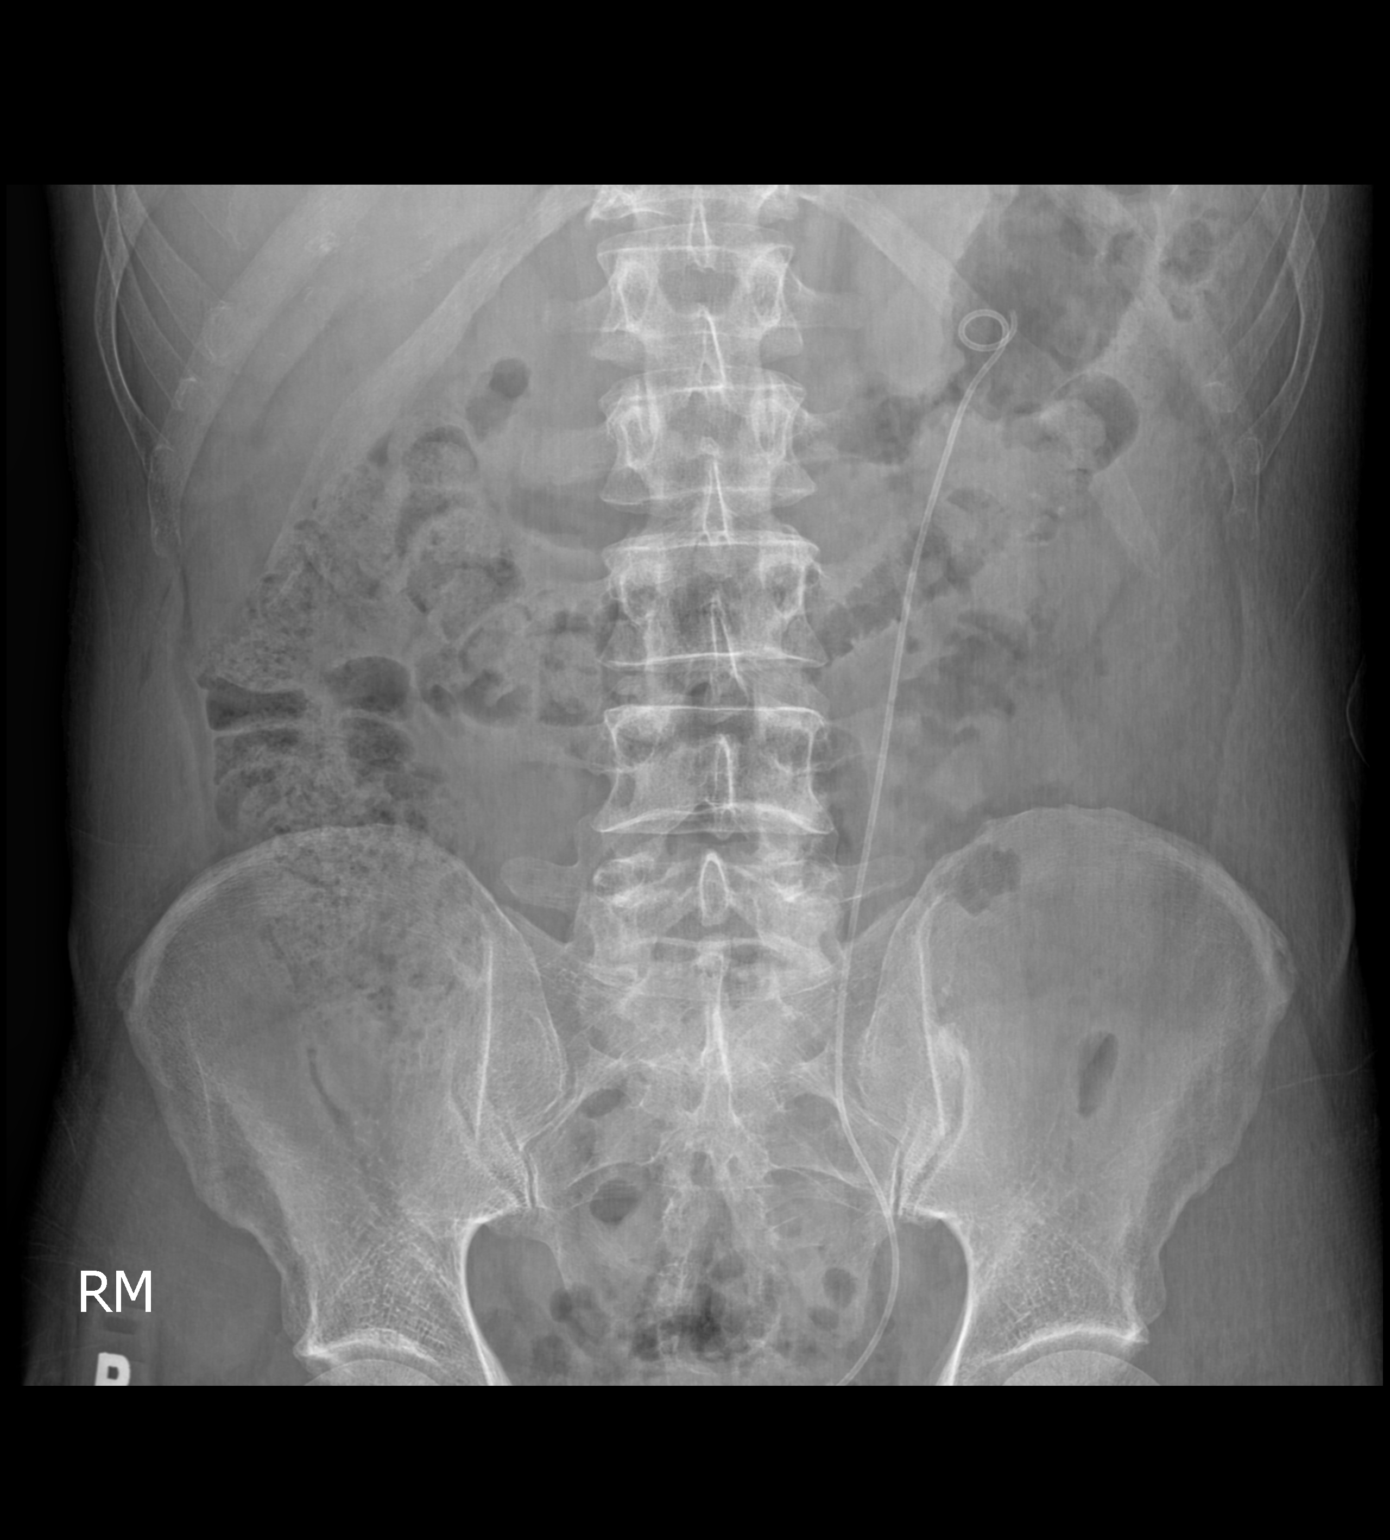

[1 of 1 positions shown; findings below may reference images not displayed]

FINDINGS: Left double-J ureteral stent in good position, upper J appears to be 
coiled in the LEFT upper pole infundibulum. Fecal debris could obscure renal 
calculi. Multiple small stones appear to be clustered projecting over the LEFT 
kidney lower pole, largest 0.2 cm. These may also represent fecal debris. 
Multiple pelvic phleboliths. No definite RIGHT renal calculi. Moderate fecal 
retention.
IMPRESSION: Left double-J ureteral stent in good position, upper J appears to be coiled in 
the upper pole infundibulum. 
Clustered LEFT lower pole small stones, largest 0.2 cm. Some of these calcific 
densities may be fecal debris.

## 2022-02-13 IMAGING — MR MULTI PARAMETRIC MRI PROSTATE W/O AND W
14 series · 48 of 48 positions shown · IV contrast (gadavist)
Comparison: CT abdomen pelvis December 03, 2021.

________________________________________________________________________________________________ 
MULTI PARAMETRIC MRI PROSTATE W/O AND W, 02/13/2022 [DATE]: 
CLINICAL INDICATION: Elevated PSA. Benign prostatic hyperplasia. Abnormal 
prostate exam.
TECHNIQUE: Multiple parametric sequences were performed. Patient was scanned on 
a 3T magnet. 
Pre-contrast: T1 axial of the entire pelvis. T2 sagittal, axial and coronal, T1 
axial, acquired of the prostate. Diffusion with multiple B values of 0555, 6677 
calculated ADC value for mapping.  
Post contrast: Rapid sequence dynamic and axial planes through the prostate and 
seminal vesicles, T1 axial with fat sat of the entire pelvis. 3-D renderings 
were reconstructed on an independent workstation. The images were also evaluated 
with Dyna CAD computer aided detection. 6.5 of Gadavist were injected 
intravenously. 1.0 mL of Gadavist was discarded. As per [HOSPITAL] guidelines 3-D reconstructions are performed with concurrent physician 
supervision.

[Series 101: survey-mst · axial · 10.0mm · 1.34mm/px · 1 of 14 slices shown]
[im 1/14]
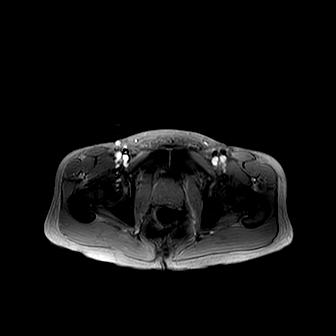

[Series 201: t1w_tse_ax · axial · 6.0mm · 0.31mm/px · 1 of 36 slices shown]
[im 1/36]
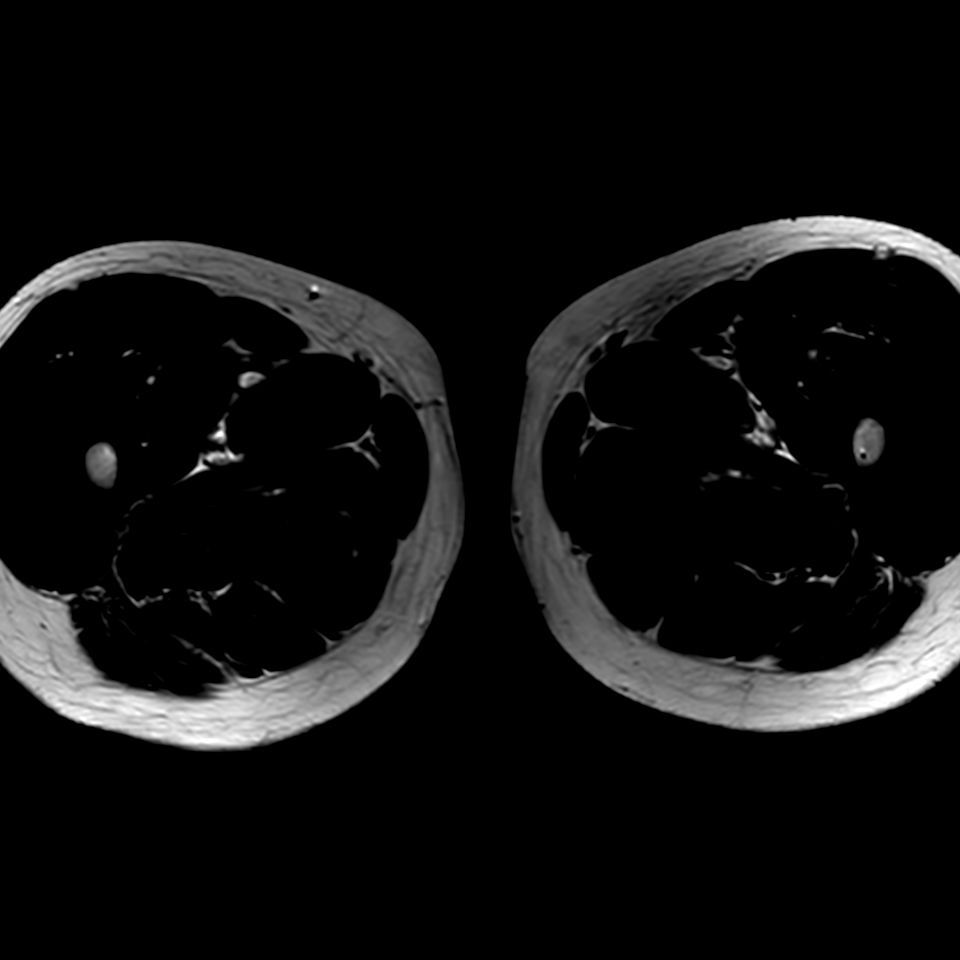

[Series 301: t2w sag · sagittal · 3.0mm · 0.45mm/px · 1 of 30 slices shown]
[im 1/30]
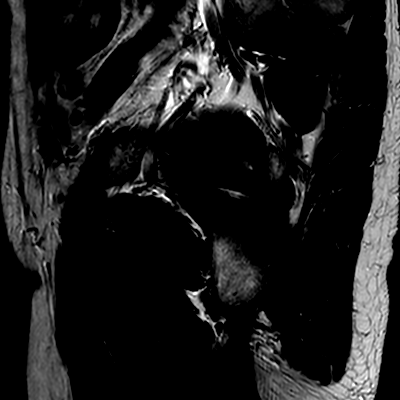

[Series 401: t2w cor · coronal · 3.0mm · 0.42mm/px · 1 of 30 slices shown]
[im 1/30]
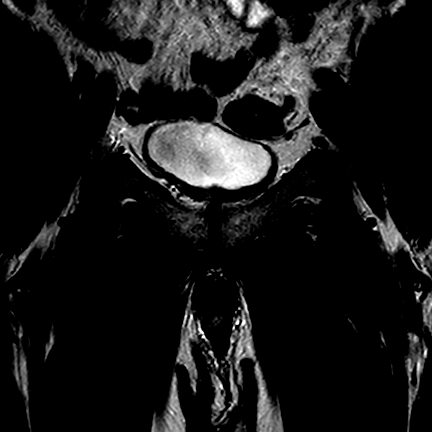

[Series 501: t2w ax · axial · 3.0mm · 0.38mm/px · 1 of 32 slices shown]
[im 1/32]
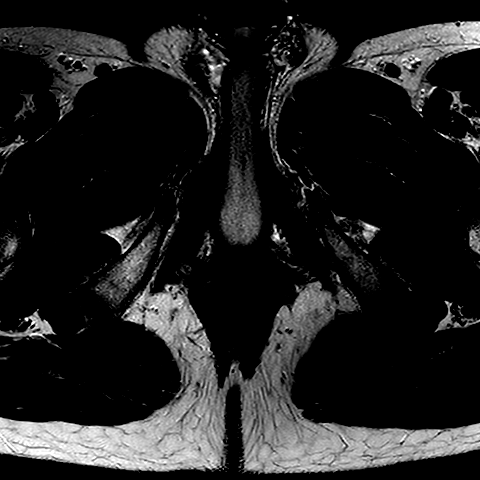

[Series 601: new-dwi_3b* 3mm* · axial · 3.0mm · 1.28mm/px · 1 of 64 slices shown]
[im 1/64]
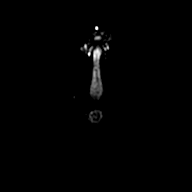

[Series 602: ADC · axial · 3.0mm · 1.28mm/px · 1 of 32 slices shown (1 of 2)]
[im 1/32]
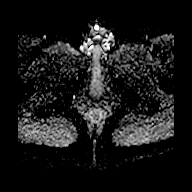

[Series 603: ADC · axial · 3.0mm · 1.28mm/px · 1 of 32 slices shown (2 of 2)]
[im 1/32]
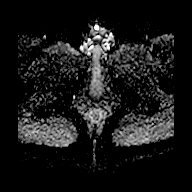

[Series 604: (id) · axial · 3.0mm · 1.28mm/px · 1 of 32 slices shown (1 of 3)]
[im 1/32]
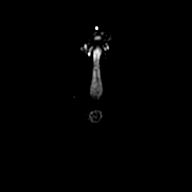

[Series 605: (id) · axial · 3.0mm · 1.28mm/px · 1 of 32 slices shown (2 of 3)]
[im 1/32]
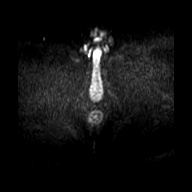

[Series 701: (id) · axial · 3.0mm · 1.28mm/px · 1 of 32 slices shown (3 of 3)]
[im 1/32]
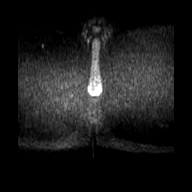

[Series 901: dyn 3mm*(ap) · axial · 3.0mm · 1.38mm/px · z∈[-63,+30]mm · 35 of 1440 slices shown]
[im 1/1440]
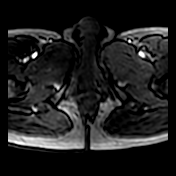
[im 43/1440]
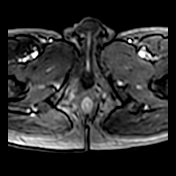
[im 85/1440]
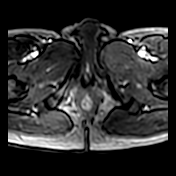
[im 127/1440]
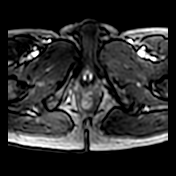
[im 170/1440]
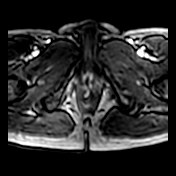
[im 212/1440]
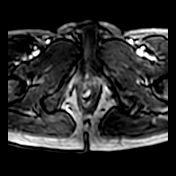
[im 254/1440]
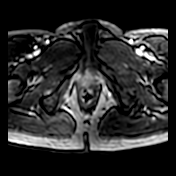
[im 297/1440]
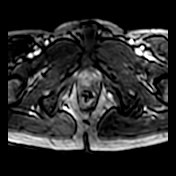
[im 339/1440]
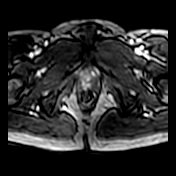
[im 381/1440]
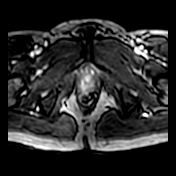
[im 424/1440]
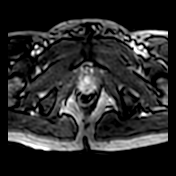
[im 466/1440]
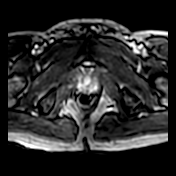
[im 508/1440]
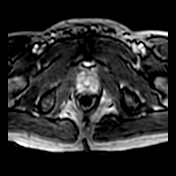
[im 551/1440]
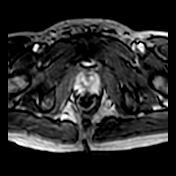
[im 593/1440]
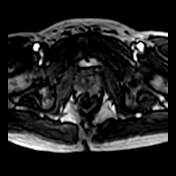
[im 635/1440]
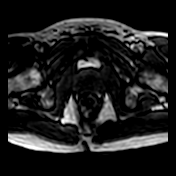
[im 678/1440]
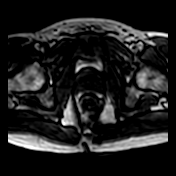
[im 720/1440]
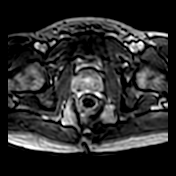
[im 762/1440]
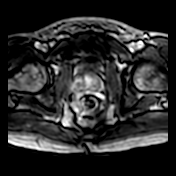
[im 805/1440]
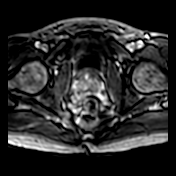
[im 847/1440]
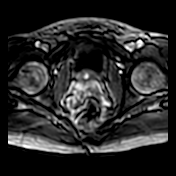
[im 889/1440]
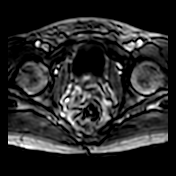
[im 932/1440]
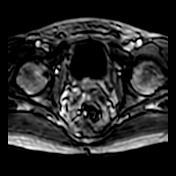
[im 974/1440]
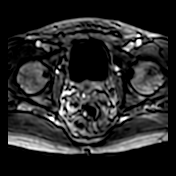
[im 1016/1440]
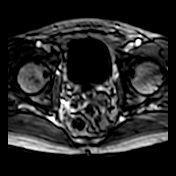
[im 1059/1440]
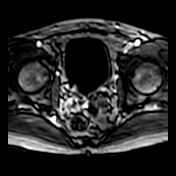
[im 1101/1440]
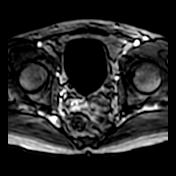
[im 1143/1440]
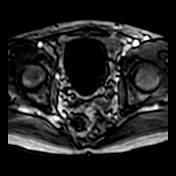
[im 1186/1440]
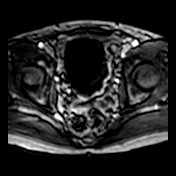
[im 1228/1440]
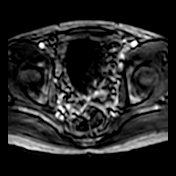
[im 1270/1440]
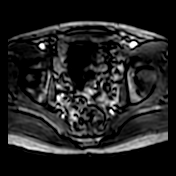
[im 1313/1440]
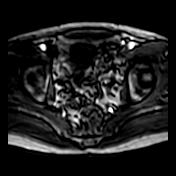
[im 1355/1440]
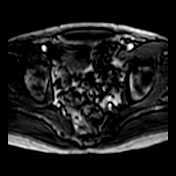
[im 1397/1440]
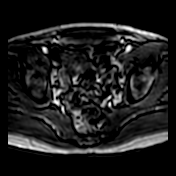
[im 1440/1440]
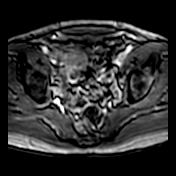

[Series 1002: DIXON · axial · 6.0mm · 0.45mm/px · 1 of 36 slices shown]
[im 1/36]
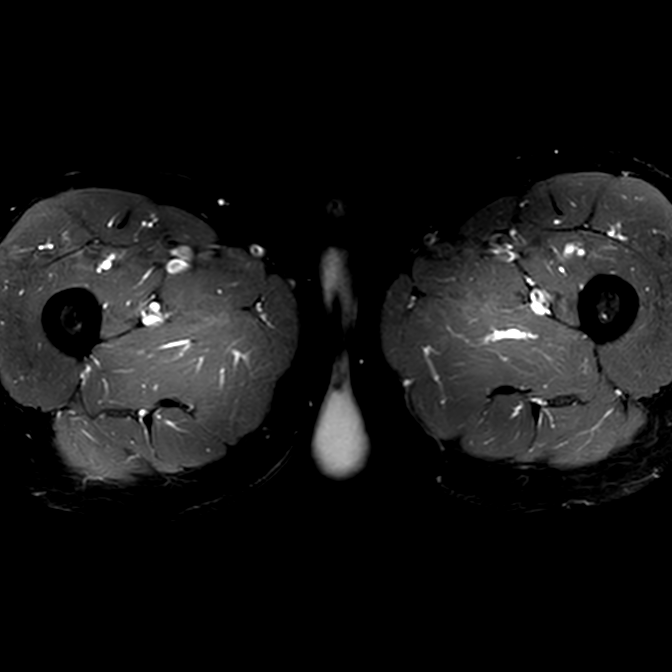

[Series 1101: T1 dynamic · axial · 3.0mm · 0.75mm/px · 1 of 84 slices shown]
[im 1/84]
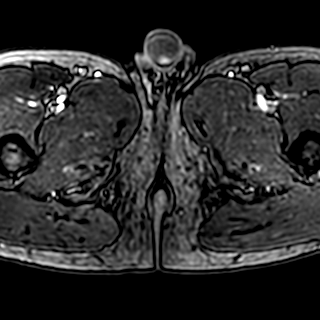

[48 of 48 positions shown; findings below may reference images not displayed]

FINDINGS: VOLUME: The prostate measures 3.9 x 3.3 x 3.9 cm with a volume of 26.6 cc. 
CENTRAL GLAND: There is minimal hyperplasia of the central gland. No suspicious 
central gland lesions are found. 
PERIPHERAL ZONE: There is subtle T2 hypointensity in the LEFT posterolateral 
peripheral zone just above the apex of the gland measuring upwards of 8 mm 
showing slight degree of restricted diffusion without asymmetric enhancement 
considered indeterminate. The peripheral zone is otherwise unremarkable. 
PROSTATE CAPSULE: Normal. 
MUSCLE SIDE WALLS: Normal. 
SEMINAL VESICLES: Normal. 
BLADDER: Normal. 
LYMPHADENOPATHY: No suspicious pelvic lymph nodes. 
BONES: No suspicious skeletal lesions in the pelvic girdle. 
ADDITIONAL FINDINGS: Uncomplicated sigmoid diverticulosis.
IMPRESSION: Indeterminate 8 mm lesion in the LEFT posterolateral peripheral zone of the 
prostate just above the apex. 
(PI-RADS 3): Intermediate ( the presence of clinically significant cancer is 
equivocal).

## 2022-05-07 IMAGING — CT CT CHEST WITH CONTRAST
2 of 4 series · 14 of 36 positions shown, 17 images · IV contrast (APPLIED)
Comparison: CT abdomen pelvis December 03, 2021.

________________________________________________________________________________________________ 
CT CHEST WITH CONTRAST, 05/07/2022 [DATE]: 
CLINICAL INDICATION: 4 mm nodule LEFT lower lobe on CT abdomen pelvis obtained 
earlier this year. 
A search for DICOM formatted images was conducted for prior CT imaging studies 
completed at a non-affiliated media free facility.
TECHNIQUE: The chest was scanned from base of neck through the lung bases with 
100 mL of Isovue 300 injected intravenously on a high resolution low dose CT 
scanner. Routine MPR and MIP 3D renderings were reconstructed on an independent 
workstation with concurrent physician supervision.

[Series 4: chest with 2.0 i31s 3 · axial · 0.79mm/px · z∈[-367,-53]mm · 11 of 173 slices shown, 14 images]
[im 8/173  mediastinal]
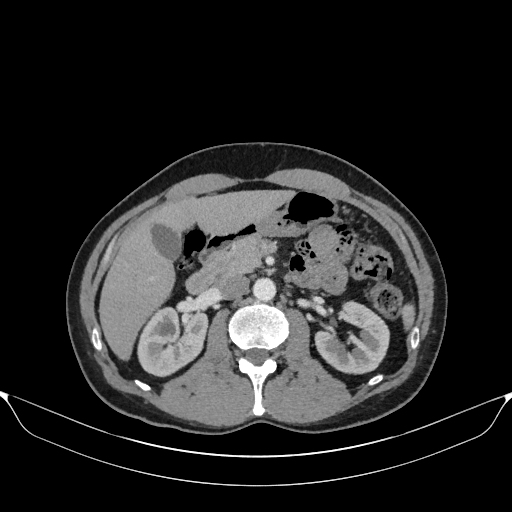
[im 8/173  lung]
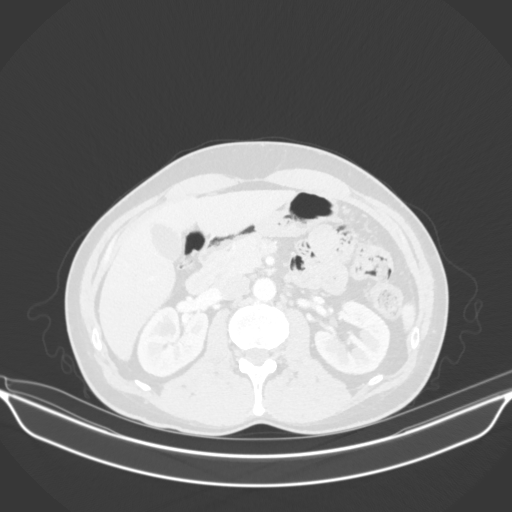
[im 24/173  lung]
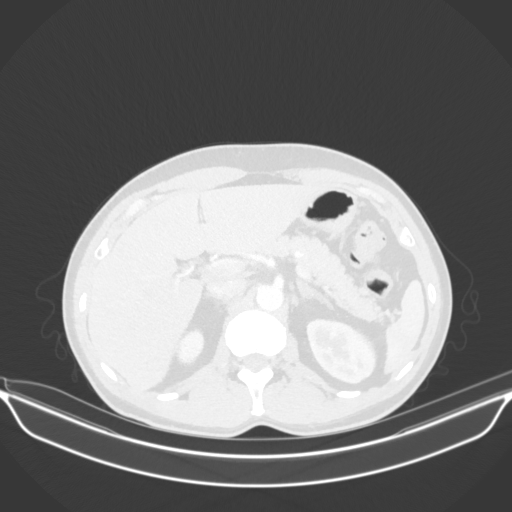
[im 40/173  lung]
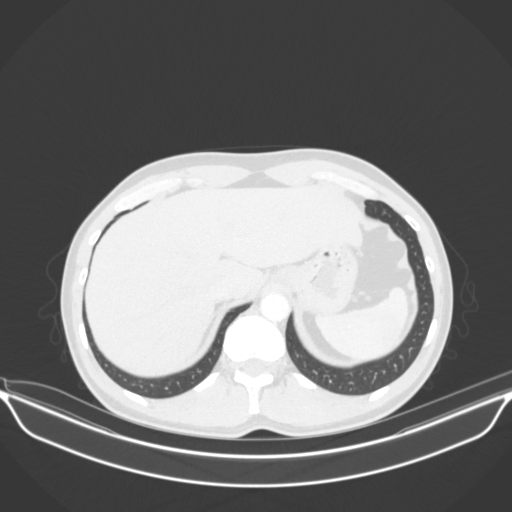
[im 55/173  lung]
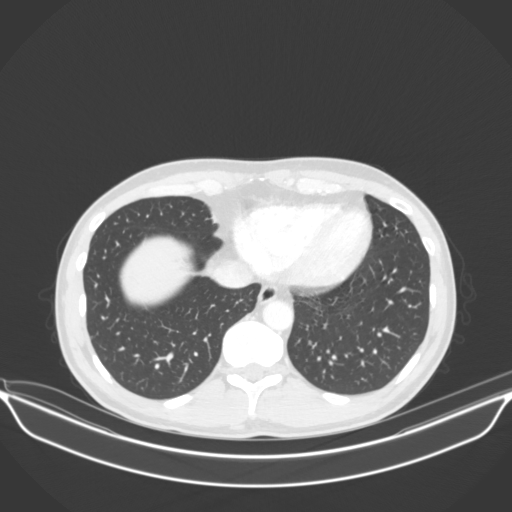
[im 71/173  mediastinal]
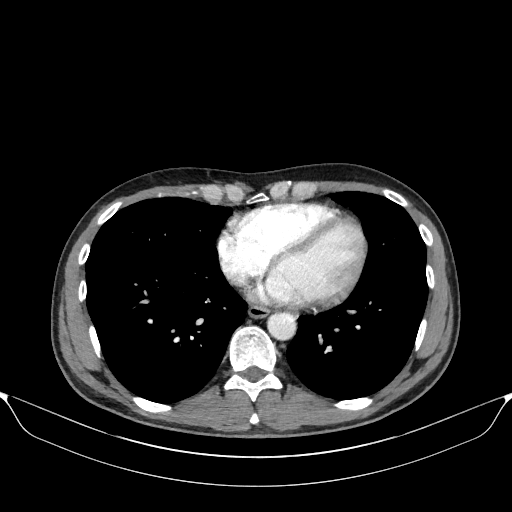
[im 71/173  lung]
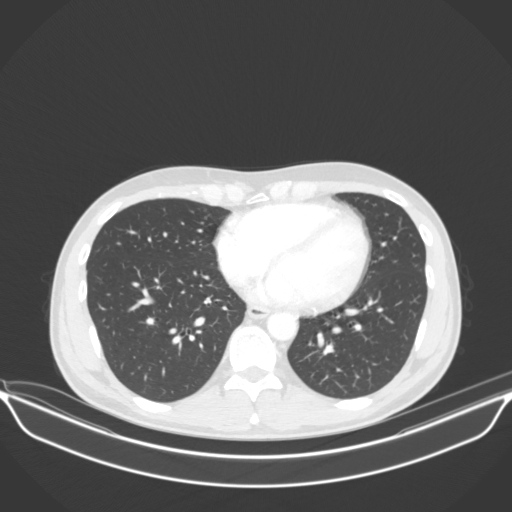
[im 87/173  lung]
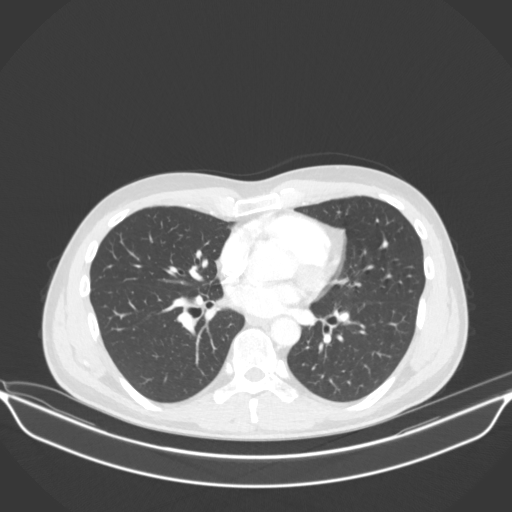
[im 102/173  lung]
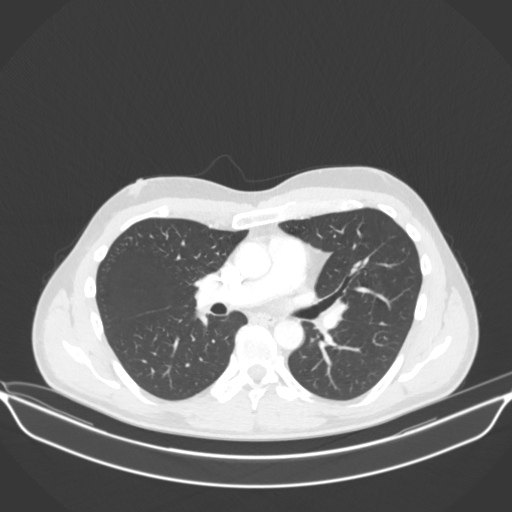
[im 118/173  lung]
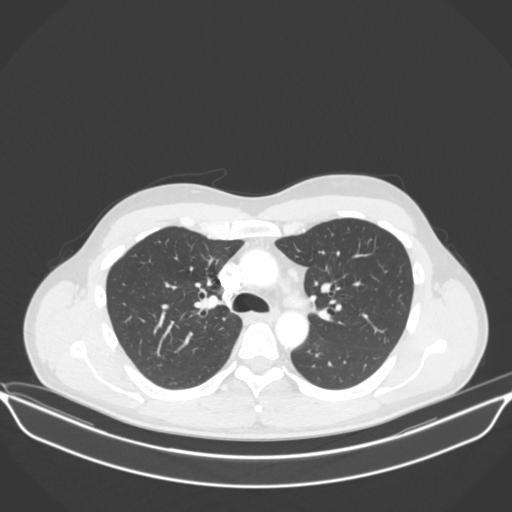
[im 133/173  mediastinal]
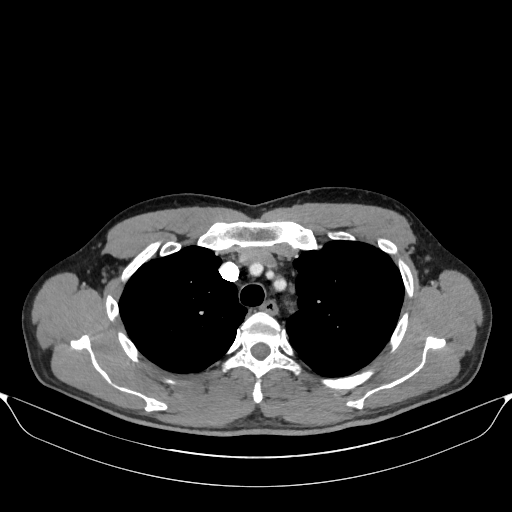
[im 133/173  lung]
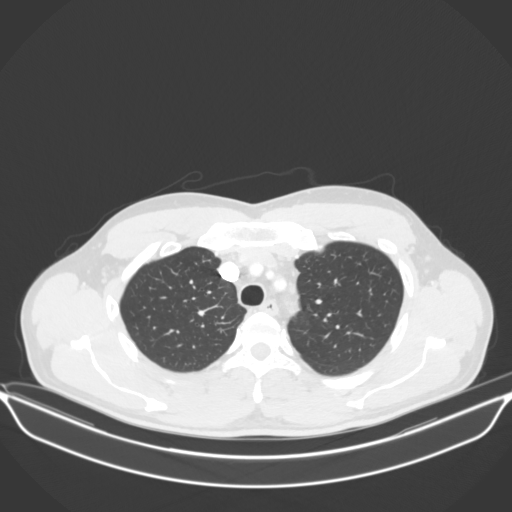
[im 149/173  lung]
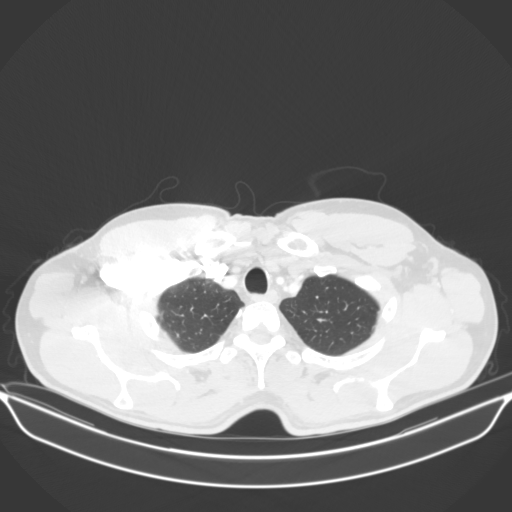
[im 165/173  lung]
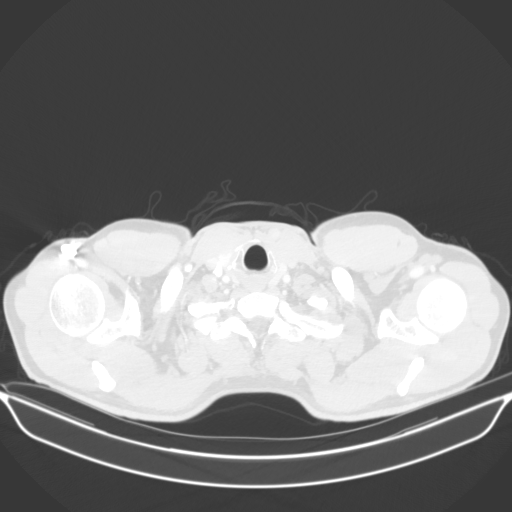

[Series 6: coronal · coronal · 0.63mm/px · 3 of 104 slices shown]
[im 21/104  lung]
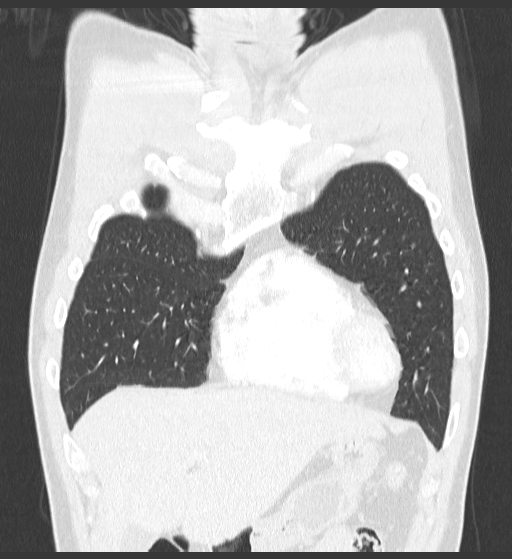
[im 42/104  lung]
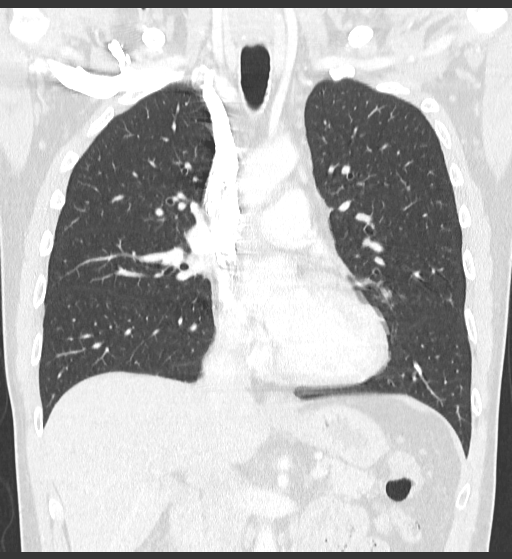
[im 62/104  lung]
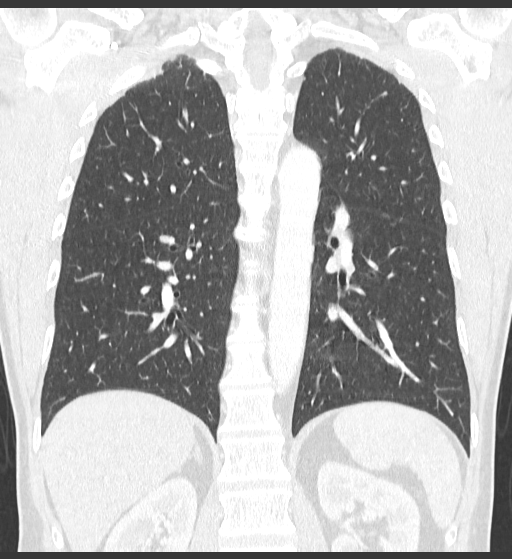

[14 of 36 positions shown; findings below may reference images not displayed]

FINDINGS: The LEFT lower lobe nodule is reidentified measuring 4.7 mm in maximum dimension 
not considered significantly changed in size from December 03, 2021. There are a 
few benign micronodules along the LEFT major fissure considered incidental. No 
other lung nodules or masses are identified. No evidence for centrilobular 
emphysema, interstitial lung disease or pneumonia. The airways appear normal. 
The heart is normal in overall size. There are no visible coronary artery 
calcifications. Caliber of the main pulmonary arteries normal. No incidental 
pulmonary emboli. Overall caliber of the thoracic aorta is normal. The esophagus 
is normal. No mediastinal mass or enlarged mediastinal lymph nodes are found. 
There is no evidence for axillary lymphadenopathy. Limited imaging of the 
inferior neck shows no significant incidental findings. Limited imaging of the 
upper abdomen shows no significant incidental findings. There is minor 
gynecomastia. Sagittal images through the spine show minimal degenerative 
changes.
IMPRESSION: 1. No significant change in 4.7 mm LEFT lower lobe nodule since December 03, 2021. 
There are a few incidental benign micronodules along the LEFT major fissure. 
Lungs are otherwise unremarkable. Recommended follow-up is in the reference at 
the end of this report. 
2. No mediastinal pathology. 
REFERENCE: 
Recommendations for pulmonary nodule follow-up according to [HOSPITAL] 
Guidelines. 
SOLITARY NODULE SIZE: <6 MM 
*LOW-RISK PATIENTS: NO FOLLOW-UP NEEDED. 
*HIGH-RISK PATIENTS: OPTIONAL CT AT 12 MONTHS. 
Solitary Nodule size: 6-8 mm 
*Low-risk patients: Followup at 6-12 months, then consider further follow-up at 
18-24 months. 
*High-risk patients: Initial followup CT at 6-12 months and then at 18-24 months 
if no change. 
Solitary Nodule size: >8 mm 
*Either low or high risk: 
*Consider follow-up CT at 3 months, and/or CT-PET, and/or biopsy. 
NOTE: 
Low risk patients: minimal or absent history of smoking and or other known risk 
factors. 
High risk patients: history of smoking or of other known risk factors (e.g. 
first degree relative with lung cancer, or exposure to asbestos, radon uranium) 
If a nodule up to 8mm is partly solid or is ground glass further follow up is 
required after 24 months to exclude possible slow growing adenocarcinoma ([facility]) 
Size is average of length and width. 
RADIATION DOSE REDUCTION: All CT scans are performed using radiation dose 
reduction techniques, when applicable.  Technical factors are evaluated and 
adjusted to ensure appropriate moderation of exposure.  Automated dose 
management technology is applied to adjust the radiation doses to minimize 
exposure while achieving diagnostic quality images.

## 2023-07-23 IMAGING — DX ABDOMEN 1 VIEW
1 series · 1 of 1 positions shown · non-contrast
Comparison: January 2022

________________________________________________________________________________________________ 
ABDOMEN 1 VIEW, 07/23/2023 [DATE]: 
CLINICAL INDICATION: Benign Prostatic Hyperplasia Without Lower Urinary Tract 
Symptoms , left-sided testicular pain and pressure for 3 weeks.

[supine]
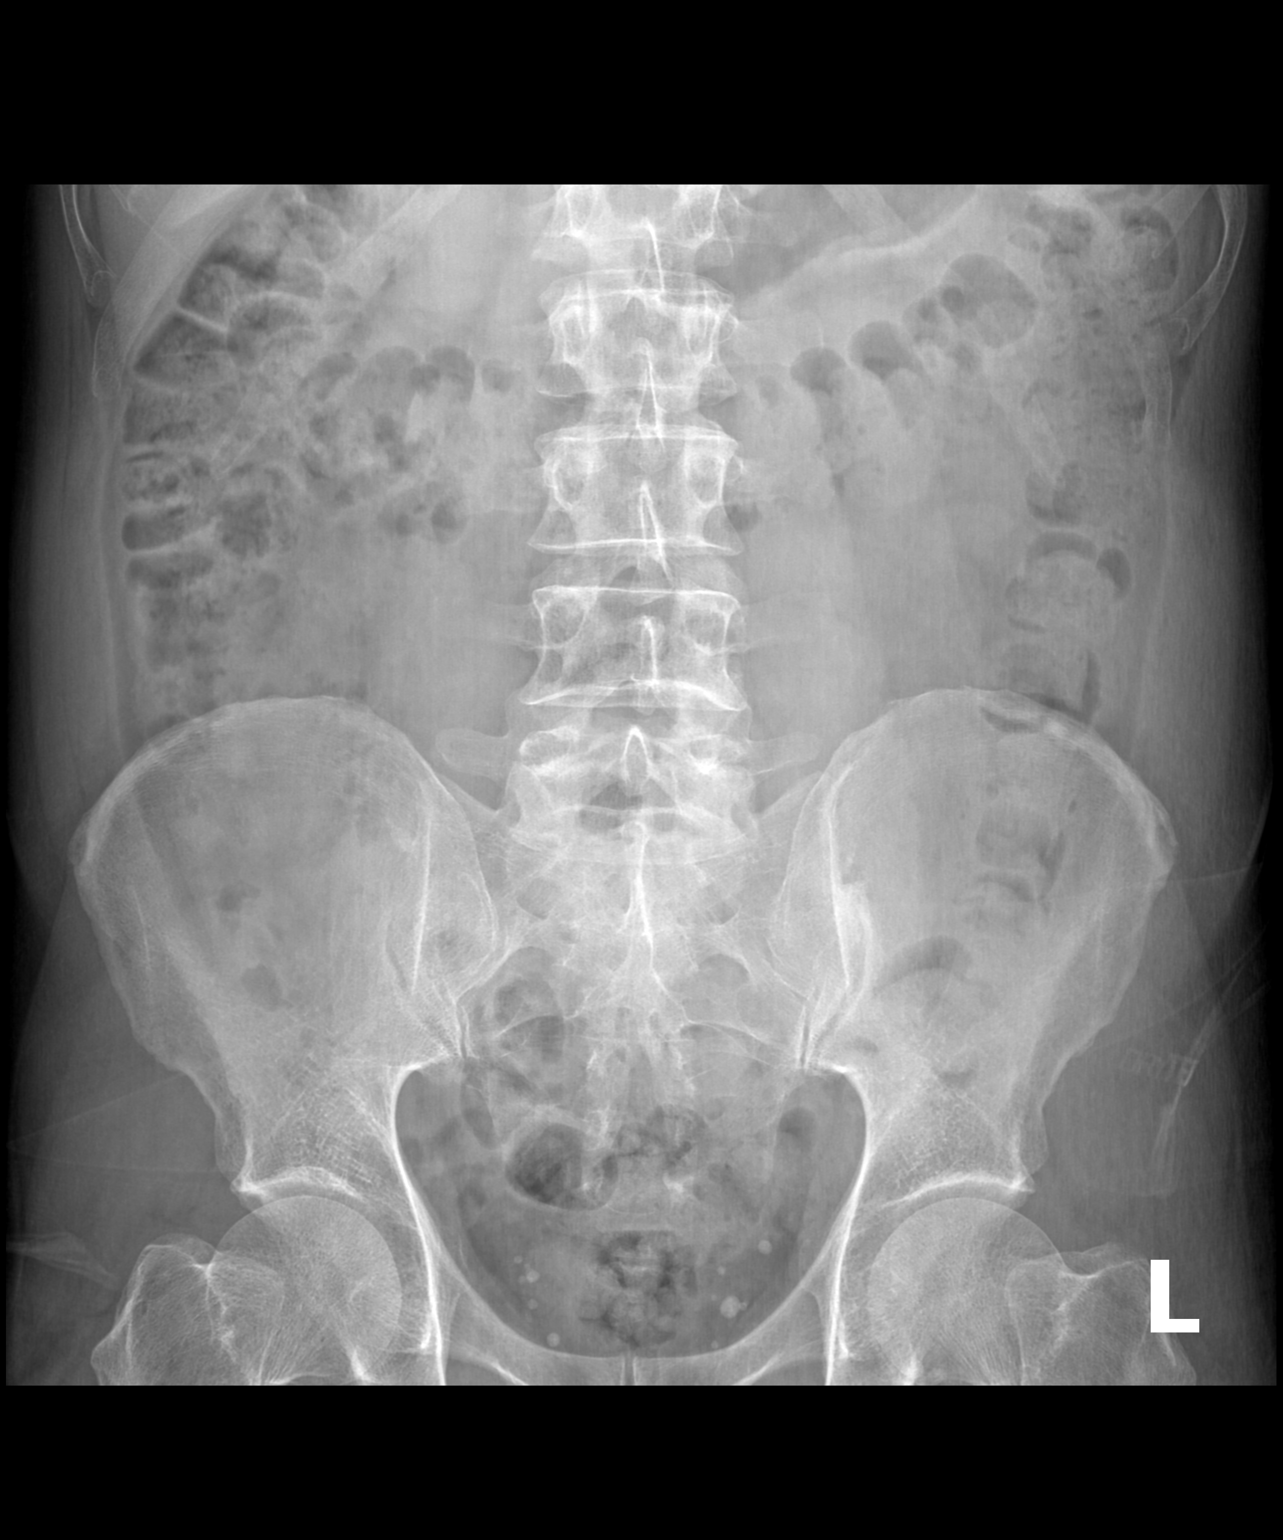

[1 of 1 positions shown; findings below may reference images not displayed]

FINDINGS: The left-sided stent seen on the prior exam has been removed. Moderate 
volume of stool the overlying colon partially obscures visualization of the 
kidneys. Numerous calcifications within the pelvis are thought to be stable in 
appearance.
IMPRESSION: No new abnormality identified.

## 2023-07-30 IMAGING — CT CT ABDOMEN PELVIS W/ UROGRAM
2 of 6 series · 10 of 46 positions shown, 11 images · IV contrast (Iodine)
Comparison: None   
Count of known CT and Cardiac Nuclear Medicine studies performed in the previous 
12 months = 0.

________________________________________________________________________________________________ 
CT ABDOMEN PELVIS W/ UROGRAM, 07/30/2023 [DATE]: 
CLINICAL INDICATION: Calculus of kidney. Pelvic and perineal pain. Unspecified 
abdominal pain history of prostate cancer with prostatectomy. 
A search for DICOM formatted images was conducted for prior CT imaging studies 
completed at a non-affiliated media free facility.
TECHNIQUE: The region of interest was scanned without and with 75 mL of Isovue 
300 MDV were injected intravenously on a high resolution CT scanner using dose 
reduction techniques. Routine MPR reconstructions were performed.

[Series 501: axial portal, idose (3) · axial · portal-venous · 0.77mm/px · z∈[-540,-126]mm · 7 of 166 slices shown, 8 images]
[im 14/166  soft-tissue]
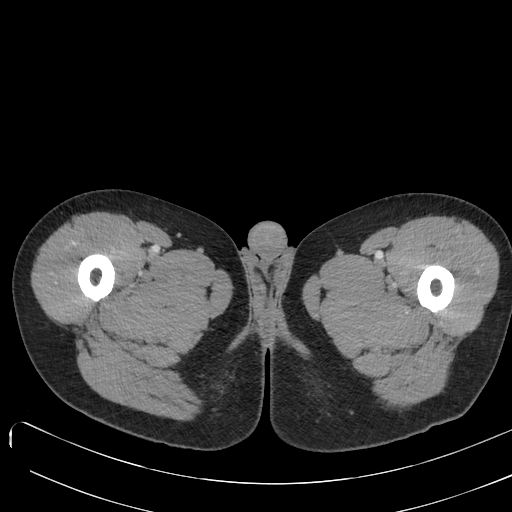
[im 14/166  bone]
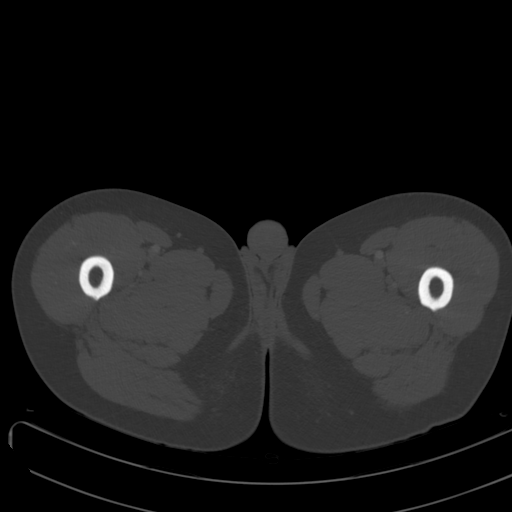
[im 42/166  soft-tissue]
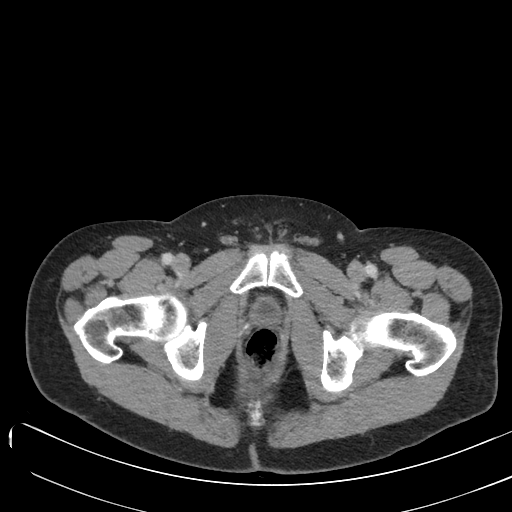
[im 56/166  soft-tissue]
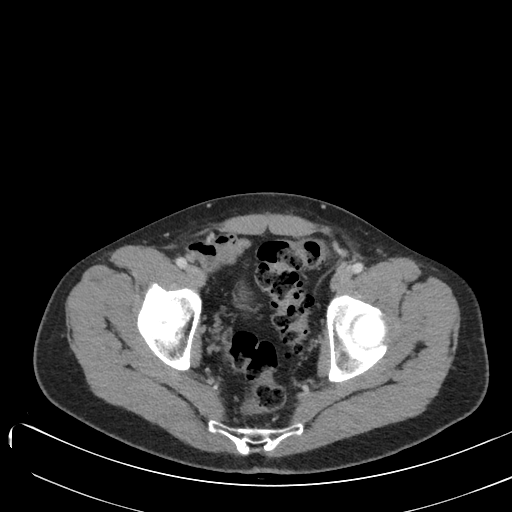
[im 83/166  soft-tissue]
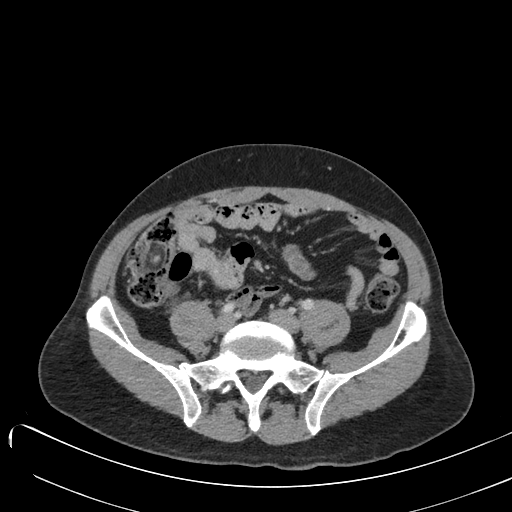
[im 111/166  soft-tissue]
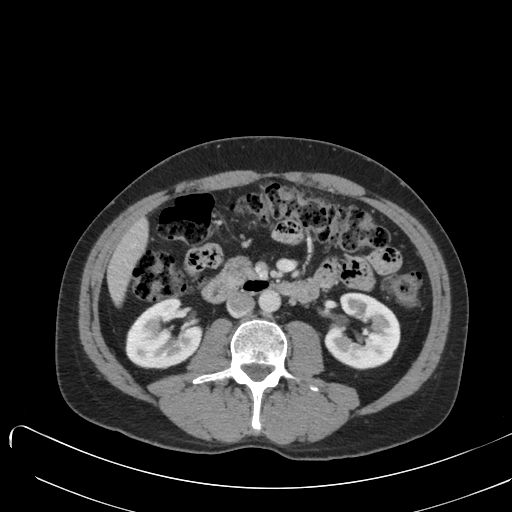
[im 124/166  soft-tissue]
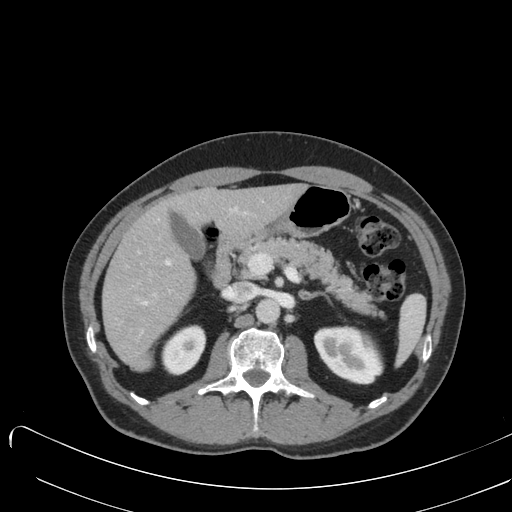
[im 152/166  soft-tissue]
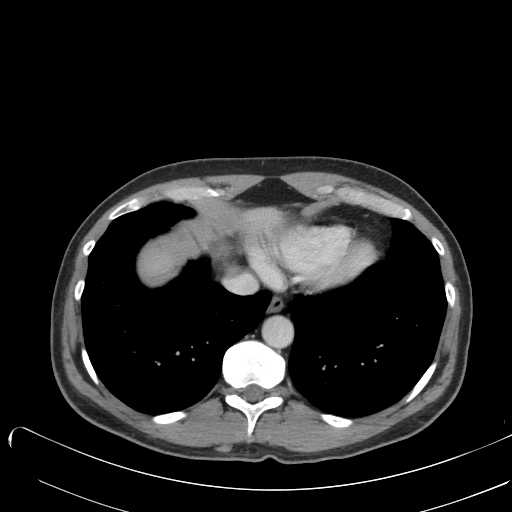

[Series 502: coronal portal, idose (3) · coronal · portal-venous · 0.45mm/px · 3 of 97 slices shown]
[im 25/97  soft-tissue]
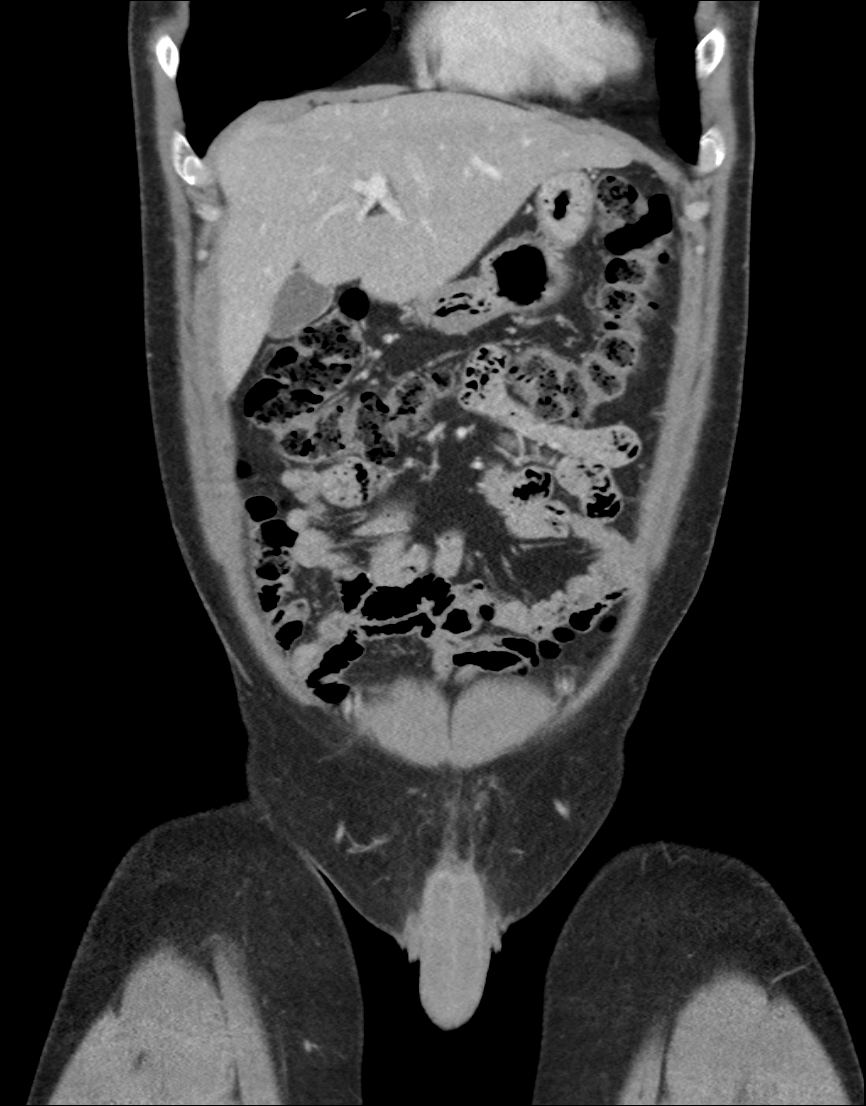
[im 49/97  soft-tissue]
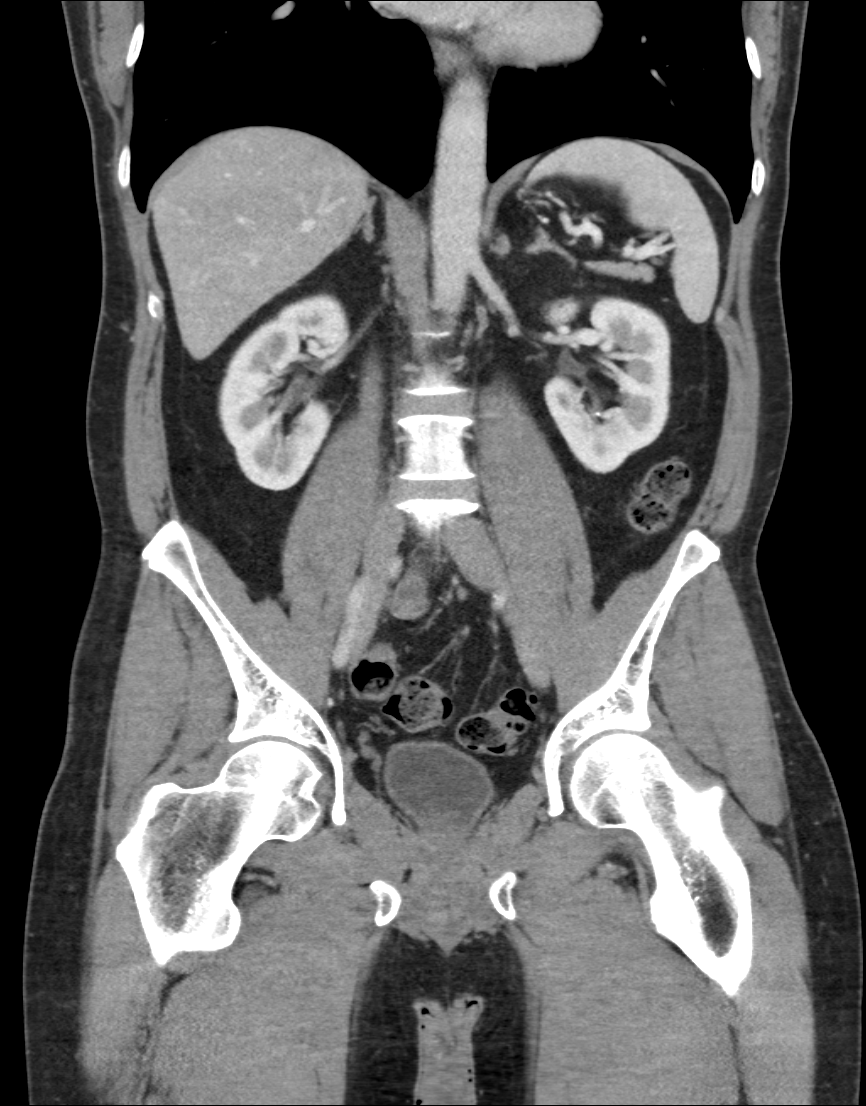
[im 73/97  soft-tissue]
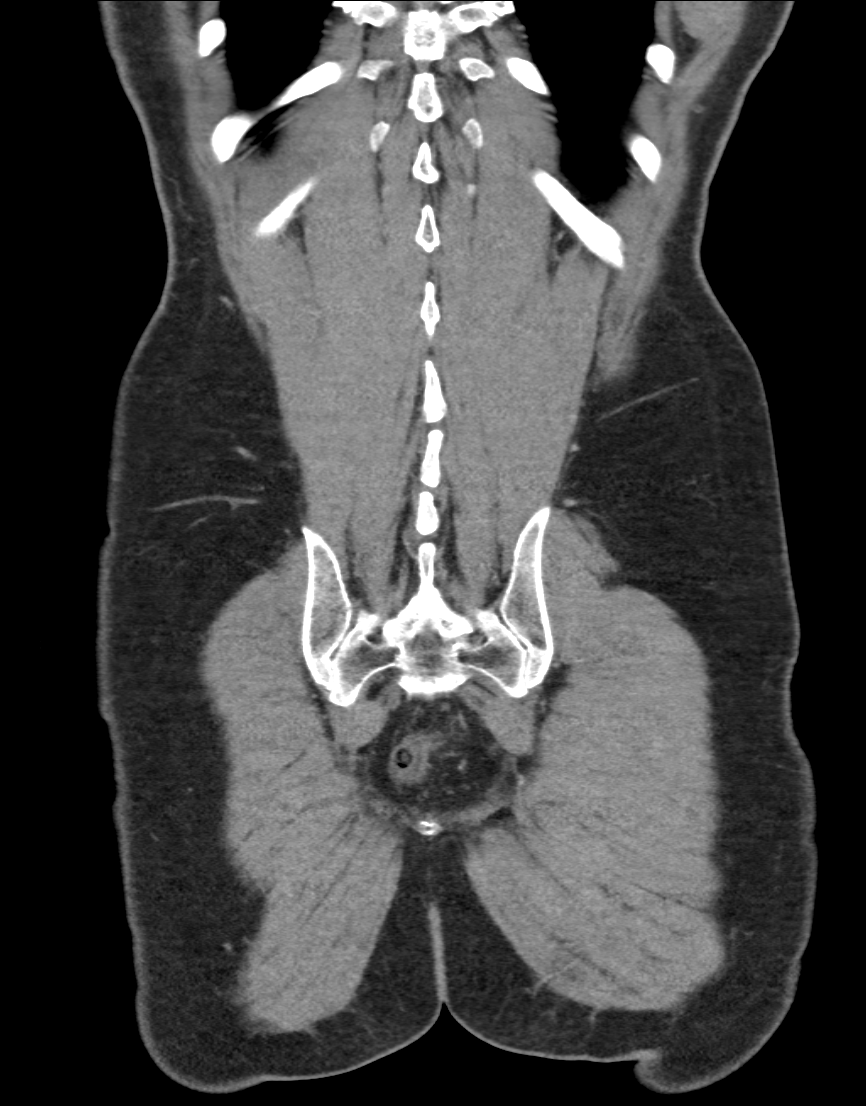

[10 of 46 positions shown; findings below may reference images not displayed]

FINDINGS: KIDNEYS: No masses.  No hydronephrosis.  2 mm nonobstructing calculus left 
kidney. Right renal cyst. 
URETERS: Normal caliber without filling defects.   
BLADDER: No mass.  Normal wall thickness.  No stone. 
LUNG BASES: Clear. 
HEPATOBILIARY: No mass or biliary dilatation. No gallstones. 
SPLEEN: Within normal limits. 
PANCREAS: No mass.  No pancreatic fluid collections. 
ADRENALS: No masses. 
LYMPH NODES: No adenopathy. 
STOMACH, SMALL BOWEL AND COLON: No bowel wall thickening or obstruction. 
VASCULAR STRUCTURES: No aneurysm.  
MUSCULOSKELETAL: No acute osseous abnormality. Scattered degenerative changes.  
ADDITIONAL FINDINGS: Postsurgical changes of prostatectomy.
IMPRESSION: 1.  Nonobstructing left renal calculus right renal cyst. 
2.  Prostatectomy. 
RADIATION DOSE REDUCTION: All CT scans are performed using radiation dose 
reduction techniques, when applicable.  Technical factors are evaluated and 
adjusted to ensure appropriate moderation of exposure.  Automated dose 
management technology is applied to adjust the radiation doses to minimize 
exposure while achieving diagnostic quality images.

## 2023-09-17 ENCOUNTER — Other Ambulatory Visit: Admit: 2023-09-17 | Discharge: 2023-09-17 | Payer: PRIVATE HEALTH INSURANCE | Primary: MD

## 2023-09-17 DIAGNOSIS — N403 Nodular prostate with lower urinary tract symptoms: Secondary | ICD-10-CM

## 2023-09-17 LAB — PSA, TOTAL AND FREE
PSA, Free: 0.65 ng/mL
PSA, Percent Free: 17 %
Prostate Specific Antigen, Total: 3.76 ng/mL (ref 0.00–4.00)

## 2023-10-27 ENCOUNTER — Other Ambulatory Visit: Admit: 2023-10-27 | Discharge: 2023-10-27 | Payer: PRIVATE HEALTH INSURANCE | Primary: MD

## 2023-10-27 ENCOUNTER — Inpatient Hospital Stay: Admit: 2023-10-27 | Discharge: 2023-10-27 | Payer: PRIVATE HEALTH INSURANCE | Attending: MD | Primary: MD

## 2023-10-27 ENCOUNTER — Ambulatory Visit: Admit: 2023-10-27 | Discharge: 2023-10-27 | Payer: PRIVATE HEALTH INSURANCE | Primary: MD

## 2023-10-27 DIAGNOSIS — R9431 Abnormal electrocardiogram [ECG] [EKG]: Secondary | ICD-10-CM

## 2023-10-27 DIAGNOSIS — R079 Chest pain, unspecified: Secondary | ICD-10-CM

## 2023-10-27 DIAGNOSIS — R0600 Dyspnea, unspecified: Secondary | ICD-10-CM

## 2023-10-27 DIAGNOSIS — R5383 Other fatigue: Secondary | ICD-10-CM

## 2023-10-27 LAB — CBC WITHOUT DIFFERENTIAL
HCT: 44.8 % (ref 42.0–54.0)
Hemoglobin: 15.6 g/dL (ref 12.0–18.0)
MCH: 30.5 pg (ref 27.0–34.0)
MCHC: 34.8 g/dL (ref 32.0–36.0)
MCV: 87.8 fL (ref 81.0–99.0)
MPV: 9.3 fL (ref 7.4–10.4)
Platelet Count: 236 10*3/ÂµL (ref 150–400)
RBC: 5.11 10*6/ÂµL (ref 4.70–6.10)
RDW: 13.1 % (ref 11.5–14.5)
WBC: 6.8 10*3/ÂµL (ref 4.8–10.8)

## 2023-10-27 LAB — COMPREHENSIVE METABOLIC PANEL
ALT - Alanine Aminotransferase: 15 IU/L (ref 7–52)
AST - Aspartate Aminotransferase: 17 IU/L (ref 10–50)
Albumin/Globulin Ratio: 1.9 (ref >0.9)
Albumin: 4.1 g/dL (ref 3.5–5.0)
Alkaline Phosphatase: 87 IU/L (ref 34–104)
Anion Gap: 6 mmol/L (ref 4–13)
BUN: 13 mg/dL (ref 6–23)
Bilirubin Total: 0.7 mg/dL (ref 0.3–1.2)
CO2 - Carbon Dioxide: 29 mmol/L (ref 21–31)
Calcium: 9.5 mg/dL (ref 8.6–10.3)
Chloride: 105 mmol/L (ref 98–111)
Creatinine: 1.01 mg/dL (ref 0.65–1.30)
Globulin: 2.2 g/dL (ref 2.0–3.7)
Glomerular Filtration Rate Estimate (Male): >60 mL/min/{1.73_m2} (ref >=60)
Glucose: 92 mg/dL (ref 80–99)
Potassium: 4.2 mmol/L (ref 3.5–5.1)
Protein Total: 6.3 g/dL (ref 6.0–8.0)
Sodium: 140 mmol/L (ref 135–143)

## 2023-10-27 LAB — CORONARY RISK LIPID PANEL
Cholesterol, HDL: 51 mg/dL (ref >=40)
Cholesterol: 203 mg/dL — ABNORMAL HIGH (ref <200)
LDL Calculated: 117 mg/dL — ABNORMAL HIGH (ref <100)
Triglyceride: 177 mg/dL — ABNORMAL HIGH (ref 30–149)

## 2023-10-27 NOTE — Progress Notes (Signed)
 Outpatient EKG completed

## 2023-11-04 ENCOUNTER — Ambulatory Visit: Admit: 2023-11-04 | Discharge: 2023-11-04 | Payer: PRIVATE HEALTH INSURANCE | Primary: MD

## 2023-11-04 DIAGNOSIS — R0600 Dyspnea, unspecified: Secondary | ICD-10-CM

## 2023-11-04 DIAGNOSIS — R942 Abnormal results of pulmonary function studies: Secondary | ICD-10-CM

## 2023-11-04 LAB — PFT-FULL W/O ABG
DLCO: 25.21 ML/(MIN*MMHG) (ref 20.78–34.92)
FEF 25-75 PRED: 3.31
FEF 25-75% (POST): 3.35 L/s (ref 1.76–5.34)
FEF 25-75%: 2.98 L/s (ref 1.76–5.34)
FEV1 (POST): 3.63 L (ref 2.82–4.40)
FEV1/FVC (POST): 80.13 % (ref 68.02–88.73)
FEV1/FVC: 78.61 % (ref 68.02–88.73)
FEV1: 3.48 L (ref 2.82–4.40)
FVC (POST): 4.53 L (ref 3.58–5.63)
FVC PRED: 4.6
FVC: 4.43 L (ref 3.58–5.63)
PEAK FLOW (POST): 9.91 L/s (ref 7.15–11.52)
Peak Flow: 10.06 L/s (ref 7.15–11.52)

## 2023-11-04 NOTE — Progress Notes (Signed)
 Full PFT completed.     Spacer and education given.

## 2023-12-21 ENCOUNTER — Inpatient Hospital Stay: Admit: 2023-12-21 | Payer: PRIVATE HEALTH INSURANCE | Attending: MD | Primary: MD

## 2023-12-21 DIAGNOSIS — I209 Angina pectoris, unspecified: Secondary | ICD-10-CM

## 2024-09-15 ENCOUNTER — Inpatient Hospital Stay: Admit: 2024-09-15 | Discharge: 2024-09-15 | Payer: PRIVATE HEALTH INSURANCE | Attending: MD | Primary: MD

## 2024-09-15 ENCOUNTER — Other Ambulatory Visit: Admit: 2024-09-15 | Discharge: 2024-09-15 | Payer: PRIVATE HEALTH INSURANCE | Primary: MD

## 2024-09-15 DIAGNOSIS — M24561 Contracture, right knee: Principal | ICD-10-CM

## 2024-09-15 DIAGNOSIS — E782 Mixed hyperlipidemia: Principal | ICD-10-CM

## 2024-09-15 LAB — CORONARY RISK LIPID PANEL
Cholesterol, HDL: 54 mg/dL (ref >=40)
Cholesterol: 187 mg/dL (ref <200)
LDL Calculated: 101 mg/dL — ABNORMAL HIGH (ref <100)
Triglyceride: 160 mg/dL — ABNORMAL HIGH (ref 30–149)

## 2024-09-15 LAB — PSA, TOTAL AND FREE
PSA, Free: 0.77 ng/mL
PSA, Percent Free: 20 %
Prostate Specific Antigen, Total: 3.87 ng/mL (ref 0.00–4.00)
